# Patient Record
Sex: Female | Born: 1990 | Race: White | Hispanic: No | Marital: Married | Smoking: Former smoker
Health system: Southern US, Community
[De-identification: ages and names within clinical notes are randomized; demographics above are authoritative.]

## PROBLEM LIST (undated history)

## (undated) DIAGNOSIS — G43909 Migraine, unspecified, not intractable, without status migrainosus: Secondary | ICD-10-CM

## (undated) DIAGNOSIS — H30899 Other chorioretinal inflammations, unspecified eye: Secondary | ICD-10-CM

## (undated) DIAGNOSIS — J329 Chronic sinusitis, unspecified: Secondary | ICD-10-CM

## (undated) DIAGNOSIS — I4891 Unspecified atrial fibrillation: Secondary | ICD-10-CM

## (undated) DIAGNOSIS — E785 Hyperlipidemia, unspecified: Secondary | ICD-10-CM

## (undated) DIAGNOSIS — M25572 Pain in left ankle and joints of left foot: Secondary | ICD-10-CM

## (undated) DIAGNOSIS — F329 Major depressive disorder, single episode, unspecified: Secondary | ICD-10-CM

## (undated) DIAGNOSIS — Z7901 Long term (current) use of anticoagulants: Secondary | ICD-10-CM

## (undated) DIAGNOSIS — F32A Depression, unspecified: Secondary | ICD-10-CM

## (undated) DIAGNOSIS — F419 Anxiety disorder, unspecified: Secondary | ICD-10-CM

## (undated) HISTORY — DX: Anxiety disorder, unspecified: F41.9

## (undated) HISTORY — DX: Depression, unspecified: F32.A

## (undated) HISTORY — PX: NASAL SINUS SURGERY: SHX719

## (undated) HISTORY — DX: Other chorioretinal inflammations, unspecified eye: H30.899

## (undated) HISTORY — PX: WISDOM TOOTH EXTRACTION: SHX21

## (undated) HISTORY — DX: Pain in left ankle and joints of left foot: M25.572

## (undated) HISTORY — DX: Chronic sinusitis, unspecified: J32.9

---
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
## 2016-11-06 ENCOUNTER — Encounter (HOSPITAL_COMMUNITY): Payer: Self-pay | Admitting: Emergency Medicine

## 2016-11-06 ENCOUNTER — Emergency Department (HOSPITAL_COMMUNITY)
Admission: EM | Admit: 2016-11-06 | Discharge: 2016-11-06 | Disposition: A | Discharge status: Home / Self Care | Payer: BLUE CROSS/BLUE SHIELD | Attending: Emergency Medicine | Admitting: Emergency Medicine

## 2016-11-06 ENCOUNTER — Emergency Department (HOSPITAL_COMMUNITY): Payer: BLUE CROSS/BLUE SHIELD

## 2016-11-06 DIAGNOSIS — S99912A Unspecified injury of left ankle, initial encounter: Secondary | ICD-10-CM | POA: Diagnosis present

## 2016-11-06 DIAGNOSIS — Y9389 Activity, other specified: Secondary | ICD-10-CM | POA: Diagnosis not present

## 2016-11-06 DIAGNOSIS — Y929 Unspecified place or not applicable: Secondary | ICD-10-CM | POA: Insufficient documentation

## 2016-11-06 DIAGNOSIS — W5512XA Struck by horse, initial encounter: Secondary | ICD-10-CM | POA: Insufficient documentation

## 2016-11-06 DIAGNOSIS — S93402A Sprain of unspecified ligament of left ankle, initial encounter: Secondary | ICD-10-CM | POA: Diagnosis not present

## 2016-11-06 DIAGNOSIS — Y998 Other external cause status: Secondary | ICD-10-CM | POA: Diagnosis not present

## 2016-11-06 DIAGNOSIS — F17200 Nicotine dependence, unspecified, uncomplicated: Secondary | ICD-10-CM | POA: Insufficient documentation

## 2016-11-06 MED ORDER — IBUPROFEN 600 MG PO TABS: 600.0000 mg | ORAL_TABLET | Freq: Four times a day (QID) | ORAL | 0 refills | Status: DC | PRN

## 2016-11-06 MED ORDER — HYDROCODONE-ACETAMINOPHEN 5-325 MG PO TABS: 1.0000 | ORAL_TABLET | ORAL | 0 refills | Status: DC | PRN

## 2016-11-06 NOTE — ED Notes (Signed)
Ortho tech notified of orders. 

## 2016-11-06 NOTE — ED Provider Notes (Signed)
[facility]-EMERGENCY DEPT Provider Note   CSN: [id number] Arrival date & time: 11/06/16  1214     History   Chief Complaint Chief Complaint  Patient presents with  . Ankle Pain    HPI Lori Boyd is a 26 y.o. female.  Pt presents to the ED today with left ankle pain.  Pt was kicked in the left ankle about a month ago by a horse and never sought treatment.  It continued to hurt, but not that badly.  She was dancing last night at a wedding when she felt a pop and severe pain.  She has not been able to put any weight on it.  The pt took 600 mg ibuprofen around 1000 which has helped the pain.      History reviewed. No pertinent past medical history.  There are no active problems to display for this patient.   No past surgical history on file.  OB History    No data available       Home Medications    Prior to Admission medications   Medication Sig Start Date End Date Taking? Authorizing Provider  HYDROcodone-acetaminophen (NORCO/VICODIN) 5-325 MG tablet Take 1 tablet by mouth every 4 (four) hours as needed. 11/06/16   Jacalyn Lefevre, MD  ibuprofen (ADVIL,MOTRIN) 600 MG tablet Take 1 tablet (600 mg total) by mouth every 6 (six) hours as needed. 11/06/16   Jacalyn Lefevre, MD    Family History No family history on file.  Social History Social History  Substance Use Topics  . Smoking status: Current Some Day Smoker  . Smokeless tobacco: Never Used  . Alcohol use Yes     Allergies   Patient has no known allergies.   Review of Systems Review of Systems  Musculoskeletal:       Left ankle pain  All other systems reviewed and are negative.    Physical Exam Updated Vital Signs BP 130/83 (BP Location: Right Arm)   Pulse 98   Temp 98.1 F (36.7 C) (Oral)   Resp 14   Ht 6' (1.829 m)   Wt 135.2 kg (298 lb)   LMP 11/06/2016 (Exact Date)   SpO2 97%   BMI 40.42 kg/m   Physical Exam  Constitutional: She is oriented to person, place, and time. She appears  well-developed and well-nourished.  HENT:  Head: Normocephalic and atraumatic.  Right Ear: External ear normal.  Left Ear: External ear normal.  Nose: Nose normal.  Mouth/Throat: Oropharynx is clear and moist.  Eyes: Pupils are equal, round, and reactive to light. Conjunctivae are normal.  Neck: Normal range of motion. Neck supple.  Cardiovascular: Normal rate, regular rhythm, normal heart sounds and intact distal pulses.   Pulmonary/Chest: Effort normal and breath sounds normal.  Abdominal: Soft. Bowel sounds are normal.  Musculoskeletal:       Left ankle: She exhibits swelling. Tenderness. Lateral malleolus tenderness found.  Neurological: She is alert and oriented to person, place, and time.  Nursing note and vitals reviewed.    ED Treatments / Results  Labs (all labs ordered are listed, but only abnormal results are displayed) Labs Reviewed - No data to display  EKG  EKG Interpretation None       Radiology Dg Ankle Complete Left  Result Date: 11/06/2016 CLINICAL DATA:  Recent inversion injury with pain, initial encounter EXAM: LEFT ANKLE COMPLETE - 3+ VIEW COMPARISON:  None. FINDINGS: Soft tissue swelling is noted primarily laterally. No acute fracture or dislocation is noted. IMPRESSION: Soft tissue swelling  without acute bony abnormality. Electronically Signed   By: Alcide CleverMark  Lukens M.D.   On: 11/06/2016 13:54    Procedures Procedures (including critical care time)  Medications Ordered in ED Medications - No data to display   Initial Impression / Assessment and Plan / ED Course  I have reviewed the triage vital signs and the nursing notes.  Pertinent labs & imaging results that were available during my care of the patient were reviewed by me and considered in my medical decision making (see chart for details).    Pt's xray neg.  She is given air splint and crutches.  She is given a printout of ankle exercises.  She is given the number to ortho.  She knows to return  if worse.   Final Clinical Impressions(s) / ED Diagnoses   Final diagnoses:  Sprain of left ankle, unspecified ligament, initial encounter    New Prescriptions New Prescriptions   HYDROCODONE-ACETAMINOPHEN (NORCO/VICODIN) 5-325 MG TABLET    Take 1 tablet by mouth every 4 (four) hours as needed.   IBUPROFEN (ADVIL,MOTRIN) 600 MG TABLET    Take 1 tablet (600 mg total) by mouth every 6 (six) hours as needed.     Jacalyn LefevreHaviland, Bernabe Dorce, MD 11/06/16 1406

## 2016-11-06 NOTE — ED Triage Notes (Signed)
Has had left ankle pain on and off for the last  Month but last night was dancing and rolled her ankle and now it bears any weight. Can wiggle toes has good pulse

## 2016-11-06 NOTE — Progress Notes (Signed)
Orthopedic Tech Progress Note Patient Details:  Lori Boyd 11-14-1990 [id number]  Ortho Devices Type of Ortho Device: ASO, Crutches Ortho Device/Splint Interventions: Application   Saul FordyceJennifer C Lashan Macias 11/06/2016, 2:26 PM

## 2016-11-09 ENCOUNTER — Encounter (INDEPENDENT_AMBULATORY_CARE_PROVIDER_SITE_OTHER): Payer: Self-pay | Admitting: Family

## 2016-11-09 ENCOUNTER — Ambulatory Visit (INDEPENDENT_AMBULATORY_CARE_PROVIDER_SITE_OTHER): Payer: BLUE CROSS/BLUE SHIELD | Admitting: Family

## 2016-11-09 VITALS — Ht 72.0 in | Wt 298.0 lb

## 2016-11-09 DIAGNOSIS — S93492A Sprain of other ligament of left ankle, initial encounter: Secondary | ICD-10-CM

## 2016-11-09 NOTE — Progress Notes (Signed)
   Office Visit Note   Patient: Lori Boyd           Date of Birth: [birth year]           MRN: [id number] Visit Date: 11/09/2016              Requested by: No referring provider defined for this encounter. PCP: Patient, No Pcp Per  Chief Complaint  Patient presents with  . Left Ankle - Injury      HPI: The patient is a 26 year old woman who is seen today for evaluation left ankle pain. She was dancing at a wedding on Saturday when she twisted her ankle is unsure of the mechanism of injury. Just felt a pop and heard a pop. She was unable to bear weight at that time. Has been seen and evaluated in the emergency department. Radiographs were negative. She is in a ASO using crutches. She states been working on range of motion but continues to be unable to bear weight.  Assessment & Plan: Visit Diagnoses:  1. Sprain of anterior talofibular ligament of left ankle, initial encounter     Plan: We'll provide her Cam Walker that this will allow her to be weightbearing as she needs to weight-bear due to class. May advance to the ASO as tolerated. Discussed the importance of working on range of motion writing the ABCs with her ankle. Use ibuprofen or Aleve as needed for pain and inflammation. May continue using ice. We will follow-up with her in 2 weeks and evaluate her return to work at that time.  Follow-Up Instructions: Return in about 2 weeks (around 11/23/2016).   Left Ankle Exam  Swelling: moderate  Tenderness  The patient is experiencing tenderness in the ATF.   Tests  Anterior drawer: negative  Other  Erythema: absent Pulse: present      Patient is alert, oriented, no adenopathy, well-dressed, normal affect, normal respiratory effort.   Imaging: No results found. No images are attached to the encounter.  Labs: No results found for: HGBA1C, ESRSEDRATE, CRP, LABURIC, REPTSTATUS, GRAMSTAIN, CULT, LABORGA  Orders:  No orders of the defined types were placed in this  encounter.  No orders of the defined types were placed in this encounter.    Procedures: No procedures performed  Clinical Data: No additional findings.  ROS:  All other systems negative, except as noted in the HPI. Review of Systems  Constitutional: Negative for chills and fever.  Musculoskeletal: Positive for arthralgias, joint swelling and myalgias.    Objective: Vital Signs: Ht 6' (1.829 m)   Wt 298 lb (135.2 kg)   LMP 11/06/2016 (Exact Date)   BMI 40.42 kg/m   Specialty Comments:  No specialty comments available.  PMFS History: There are no active problems to display for this patient.  No past medical history on file.  No family history on file.  No past surgical history on file. Social History   Occupational History  . Not on file.   Social History Main Topics  . Smoking status: Current Some Day Smoker  . Smokeless tobacco: Never Used  . Alcohol use Yes  . Drug use: Yes    Types: Marijuana  . Sexual activity: Not on file

## 2016-11-24 ENCOUNTER — Ambulatory Visit (INDEPENDENT_AMBULATORY_CARE_PROVIDER_SITE_OTHER): Payer: BLUE CROSS/BLUE SHIELD | Admitting: Orthopedic Surgery

## 2016-11-24 ENCOUNTER — Encounter (INDEPENDENT_AMBULATORY_CARE_PROVIDER_SITE_OTHER): Payer: Self-pay | Admitting: Orthopedic Surgery

## 2016-11-24 DIAGNOSIS — S93492A Sprain of other ligament of left ankle, initial encounter: Secondary | ICD-10-CM | POA: Insufficient documentation

## 2016-11-24 DIAGNOSIS — S93492D Sprain of other ligament of left ankle, subsequent encounter: Secondary | ICD-10-CM

## 2016-11-24 NOTE — Progress Notes (Signed)
   Office Visit Note   Patient: Lori Boyd           Date of Birth: May 17, 1990           MRN: [id number] Visit Date: 11/24/2016              Requested by: No referring provider defined for this encounter. PCP: Patient, No Pcp Per  Chief Complaint  Patient presents with  . Left Ankle - Follow-up      HPI: Patient is a 26 year old woman who presents follow-up status post sprain of the anterior talofibular ligament left ankle she is about 3 weeks out. Patient states she feels like she has instability of the ankle even with wearing the ankle stabilizing orthosis. She states she cannot stand for greater than 25 minutes at a time.  Assessment & Plan: Visit Diagnoses:  1. Sprain of anterior talofibular ligament of left ankle, subsequent encounter     Plan: Recommended continuing wearing the ASO for 3 weeks at that time follow-up at the ankle was still unstable we would need to consider reconstruction of the anterior talofibular ligament. 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  Follow-Up Instructions: Return in about 3 weeks (around 12/15/2016).   Ortho Exam  Patient is alert, oriented, no adenopathy, well-dressed, normal affect, normal respiratory effort. Patient has an antalgic gait. She stands with her feet plantar grade. Anterior drawer the right ankle is stable with no laxity. Anterior drawer on the left ankle has a positive clunk with anterior translation of about 3 mm. She is tender to palpation over the anterior talofibular ligament. Anterior ankle joint is minimally tender to palpation. Patient is also tender to palpation over the calcaneofibular ligament. Deltoid ligament is nontender to palpation. There is no ecchymosis no bruising.  Imaging: No results found. No images are attached to the encounter.  Labs: No results found for: HGBA1C, ESRSEDRATE, CRP, LABURIC, REPTSTATUS, GRAMSTAIN, CULT,  LABORGA  Orders:  No orders of the defined types were placed in this encounter.  No orders of the defined types were placed in this encounter.    Procedures: No procedures performed  Clinical Data: No additional findings.  ROS:  All other systems negative, except as noted in the HPI. Review of Systems  Objective: Vital Signs: LMP 11/06/2016 (Exact Date)   Specialty Comments:  No specialty comments available.  PMFS History: Patient Active Problem List   Diagnosis Date Noted  . Sprain of anterior talofibular ligament of left ankle 11/24/2016   No past medical history on file.  No family history on file.  No past surgical history on file. Social History   Occupational History  . Not on file.   Social History Main Topics  . Smoking status: Current Some Day Smoker  . Smokeless tobacco: Never Used  . Alcohol use Yes  . Drug use: Yes    Types: Marijuana  . Sexual activity: Not on file

## 2016-12-14 ENCOUNTER — Encounter (INDEPENDENT_AMBULATORY_CARE_PROVIDER_SITE_OTHER): Payer: Self-pay | Admitting: Orthopedic Surgery

## 2016-12-14 ENCOUNTER — Ambulatory Visit (INDEPENDENT_AMBULATORY_CARE_PROVIDER_SITE_OTHER): Payer: BLUE CROSS/BLUE SHIELD | Admitting: Orthopedic Surgery

## 2016-12-14 DIAGNOSIS — S9305XA Dislocation of left ankle joint, initial encounter: Secondary | ICD-10-CM | POA: Insufficient documentation

## 2016-12-14 DIAGNOSIS — S93492D Sprain of other ligament of left ankle, subsequent encounter: Secondary | ICD-10-CM

## 2016-12-14 DIAGNOSIS — S9305XS Dislocation of left ankle joint, sequela: Secondary | ICD-10-CM

## 2016-12-14 DIAGNOSIS — IMO0001 Reserved for inherently not codable concepts without codable children: Secondary | ICD-10-CM

## 2016-12-14 DIAGNOSIS — S93332A Other subluxation of left foot, initial encounter: Secondary | ICD-10-CM | POA: Insufficient documentation

## 2016-12-14 NOTE — Progress Notes (Signed)
   Office Visit Note   Patient: Lori Boyd           Date of Birth: [birth year]           MRN: [id number] Visit Date: 12/14/2016              Requested by: No referring provider defined for this encounter. PCP: Patient, No Pcp Per  Chief Complaint  Patient presents with  . Left Ankle - Follow-up      HPI: Patient is a 26 year old woman is status post sprain left ankle. She has worn an ASO and currently is not wearing it. She complains of laxity and dislocation of the peroneal tendons she states they pop out of place. She has no pain anteriorly over the ankle.  Assessment & Plan: Visit Diagnoses:  1. Sprain of anterior talofibular ligament of left ankle, subsequent encounter   2. Subluxation of peroneal tendon of left foot, sequela     Plan: Due to the instability of anterior talofibular ligament and peroneal tendon subluxation patient states she like to proceed with surgical intervention. We'll plan for reconstruction of the peroneal tendon retinaculum as well as repair of the anterior talofibular ligament. Risks and benefits were discussed patient states she understands wish to proceed at this time discussed that she will need to be nonweightbearing until the incision heals and then will be weightbearing in a fracture boot for a total of 6 weeks.  Follow-Up Instructions: Return in about 2 weeks (around 12/28/2016).   Ortho Exam  Patient is alert, oriented, no adenopathy, well-dressed, normal affect, normal respiratory effort. Examination patient has no redness no cellulitis. With range of motion of her ankle and resisted eversion patient's peroneal tendons subluxed they are tender to palpation. She is also tender to palpation over the anterior talofibular ligament she has a positive anterior drawer of about 4 mm. There is a negative anterior drawer on the right ankle. Anteriorly or her ankle is nontender to palpation or with range of motion.  Imaging: No results found. No  images are attached to the encounter.  Labs: No results found for: HGBA1C, ESRSEDRATE, CRP, LABURIC, REPTSTATUS, GRAMSTAIN, CULT, LABORGA  Orders:  No orders of the defined types were placed in this encounter.  No orders of the defined types were placed in this encounter.    Procedures: No procedures performed  Clinical Data: No additional findings.  ROS:  All other systems negative, except as noted in the HPI. Review of Systems  Objective: Vital Signs: There were no vitals taken for this visit.  Specialty Comments:  No specialty comments available.  PMFS History: Patient Active Problem List   Diagnosis Date Noted  . 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 Sprain of anterior talofibular ligament of left ankle 11/24/2016   No past medical history on file.  No family history on file.  No past surgical history on file. Social History   Occupational History  . Not on file.   Social History Main Topics  . Smoking status: Current Some Day Smoker  . Smokeless tobacco: Never Used  . Alcohol use Yes  . Drug use: Yes    Types: Marijuana  . Sexual activity: Not on file

## 2017-01-03 DIAGNOSIS — S86392D Other injury of muscle(s) and tendon(s) of peroneal muscle group at lower leg level, left leg, subsequent encounter: Secondary | ICD-10-CM | POA: Diagnosis not present

## 2017-01-03 DIAGNOSIS — S93412D Sprain of calcaneofibular ligament of left ankle, subsequent encounter: Secondary | ICD-10-CM | POA: Diagnosis not present

## 2017-01-03 HISTORY — PX: OTHER SURGICAL HISTORY: SHX169

## 2017-01-12 ENCOUNTER — Ambulatory Visit (INDEPENDENT_AMBULATORY_CARE_PROVIDER_SITE_OTHER): Payer: BLUE CROSS/BLUE SHIELD | Admitting: Orthopedic Surgery

## 2017-01-12 ENCOUNTER — Encounter (INDEPENDENT_AMBULATORY_CARE_PROVIDER_SITE_OTHER): Payer: Self-pay | Admitting: Orthopedic Surgery

## 2017-01-12 DIAGNOSIS — S93492D Sprain of other ligament of left ankle, subsequent encounter: Secondary | ICD-10-CM

## 2017-01-12 DIAGNOSIS — IMO0001 Reserved for inherently not codable concepts without codable children: Secondary | ICD-10-CM

## 2017-01-12 DIAGNOSIS — S9305XS Dislocation of left ankle joint, sequela: Secondary | ICD-10-CM

## 2017-01-12 NOTE — Progress Notes (Signed)
   Post-Op Visit Note   Patient: Lori Boyd           Date of Birth: 01-Jan-1991           MRN: [id number] Visit Date: 01/12/2017 PCP: Patient, No Pcp Per  Chief Complaint:  Chief Complaint  Patient presents with  . Left Ankle - Routine Post Op    01/03/17 Left Ankle Reconstruction of Peroneal Tendon Retinaculum and repair of Anterior Talofibular Ligament. 9 days post op.     HPI:  HPI The patient is a 26 year old woman seen today status post left ankle surgery on October 19. She's been nonweightbearing with a kneeling scooter.  Ortho Exam Incision well approximated with sutures, healing well there is no erythema drainage or sign of infection. Minimal swelling to her ankle.  Visit Diagnoses:  1. Subluxation of peroneal tendon of left foot, sequela   2. Sprain of anterior talofibular ligament of left ankle, subsequent encounter     Plan: begin daily dial soap cleansing. Continue nonweight bearing. Dry dressing changes. Follow up in 1 week for suture removal.  Follow-Up Instructions: Return in about 1 week (around 01/19/2017).   Imaging: No results found.  Orders:  No orders of the defined types were placed in this encounter.  No orders of the defined types were placed in this encounter.    PMFS History: Patient Active Problem List   Diagnosis Date Noted  . 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 Sprain of anterior talofibular ligament of left ankle 11/24/2016   No past medical history on file.  No family history on file.  Past Surgical History:  Procedure Laterality Date  . left ankle reconstruction Left 01/03/2017   Dr. Lajoyce Cornersuda   Social History   Occupational History  . Not on file.   Social History Main Topics  . Smoking status: Current Some Day Smoker  . Smokeless tobacco: Never Used  . Alcohol use Yes  . Drug use: Yes    Types: Marijuana  . Sexual activity: Not on file

## 2017-01-16 ENCOUNTER — Inpatient Hospital Stay (INDEPENDENT_AMBULATORY_CARE_PROVIDER_SITE_OTHER): Payer: BLUE CROSS/BLUE SHIELD | Admitting: Orthopedic Surgery

## 2017-01-18 ENCOUNTER — Ambulatory Visit (INDEPENDENT_AMBULATORY_CARE_PROVIDER_SITE_OTHER): Payer: BLUE CROSS/BLUE SHIELD | Admitting: Orthopedic Surgery

## 2017-01-18 ENCOUNTER — Encounter (INDEPENDENT_AMBULATORY_CARE_PROVIDER_SITE_OTHER): Payer: Self-pay

## 2017-01-19 ENCOUNTER — Ambulatory Visit (INDEPENDENT_AMBULATORY_CARE_PROVIDER_SITE_OTHER): Payer: BLUE CROSS/BLUE SHIELD | Admitting: Orthopedic Surgery

## 2017-01-19 DIAGNOSIS — IMO0001 Reserved for inherently not codable concepts without codable children: Secondary | ICD-10-CM

## 2017-01-19 DIAGNOSIS — S93492D Sprain of other ligament of left ankle, subsequent encounter: Secondary | ICD-10-CM

## 2017-01-19 DIAGNOSIS — S9305XS Dislocation of left ankle joint, sequela: Secondary | ICD-10-CM

## 2017-01-19 NOTE — Progress Notes (Signed)
   Office Visit Note   Patient: Lori Boyd           Date of Birth: [birth year]           MRN: [id number] Visit Date: 01/19/2017              Requested by: No referring provider defined for this encounter. PCP: Patient, No Pcp Per  No chief complaint on file.     HPI: Patient presents 1 week follow-up status post reconstruction of the peroneal retinaculum and repair of the anterior talofibular ligament with reinforcement of the extensor retinaculum to the left ankle.  Patient is currently on a kneeling scooter she states her fracture boot is at home.  She has been nonweightbearing.  Assessment & Plan: Visit Diagnoses:  1. Subluxation of peroneal tendon of left foot, sequela   2. Sprain of anterior talofibular ligament of left ankle, subsequent encounter     Plan: Continue nonweightbearing she should wear the fracture boot for transfers.  She is given instructions to continue working on dorsiflexion of the ankle.  Instructions for scar massage.  Weightbearing as tolerated in the fracture boot at follow-up.  Follow-Up Instructions: Return in about 2 weeks (around 02/02/2017).   Ortho Exam  Patient is alert, oriented, no adenopathy, well-dressed, normal affect, normal respiratory effort. Examination the incision is healed quite nicely there is no redness no cellulitis no drainage.  We will harvest the sutures today.  She has good range of motion of the ankle.  She has anterior drawer of about 2 mm.  Her ankle is stable.  Imaging: No results found. No images are attached to the encounter.  Labs: No results found for: HGBA1C, ESRSEDRATE, CRP, LABURIC, REPTSTATUS, GRAMSTAIN, CULT, LABORGA  Orders:  No orders of the defined types were placed in this encounter.  No orders of the defined types were placed in this encounter.    Procedures: No procedures performed  Clinical Data: No additional findings.  ROS:  All other systems negative, except as noted in the  HPI. Review of Systems  Objective: Vital Signs: There were no vitals taken for this visit.  Specialty Comments:  No specialty comments available.  PMFS History: Patient Active Problem List   Diagnosis Date Noted  . 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 Sprain of anterior talofibular ligament of left ankle 11/24/2016   No past medical history on file.  No family history on file.  Past Surgical History:  Procedure Laterality Date  . left ankle reconstruction Left 01/03/2017   Dr. Lajoyce Cornersuda   Social History   Occupational History  . Not on file.   Social History Main Topics  . Smoking status: Current Some Day Smoker  . Smokeless tobacco: Never Used  . Alcohol use Yes  . Drug use: Yes    Types: Marijuana  . Sexual activity: Not on file

## 2017-02-08 ENCOUNTER — Ambulatory Visit (INDEPENDENT_AMBULATORY_CARE_PROVIDER_SITE_OTHER): Payer: BLUE CROSS/BLUE SHIELD | Admitting: Orthopedic Surgery

## 2017-02-14 ENCOUNTER — Encounter (INDEPENDENT_AMBULATORY_CARE_PROVIDER_SITE_OTHER): Payer: Self-pay | Admitting: Orthopedic Surgery

## 2017-02-14 ENCOUNTER — Ambulatory Visit (INDEPENDENT_AMBULATORY_CARE_PROVIDER_SITE_OTHER): Payer: BLUE CROSS/BLUE SHIELD | Admitting: Orthopedic Surgery

## 2017-02-14 VITALS — Ht 72.0 in | Wt 298.0 lb

## 2017-02-14 DIAGNOSIS — S93492D Sprain of other ligament of left ankle, subsequent encounter: Secondary | ICD-10-CM

## 2017-02-14 DIAGNOSIS — S9305XS Dislocation of left ankle joint, sequela: Secondary | ICD-10-CM

## 2017-02-14 DIAGNOSIS — IMO0001 Reserved for inherently not codable concepts without codable children: Secondary | ICD-10-CM

## 2017-02-14 NOTE — Progress Notes (Signed)
   Office Visit Note   Patient: Lori Boyd           Date of Birth: 1990/11/24           MRN: [id number] Visit Date: 02/14/2017              Requested by: No referring provider defined for this encounter. PCP: Patient, No Pcp Per  Chief Complaint  Patient presents with  . Left Ankle - Routine Post Op    01/03/17 reconstruction peroneal retinaculum and repair anterior talofibular ligament       HPI: Patient is a 26 year old woman who presents status post anterior talofibular ligament reconstruction as well as a peroneal tendon retinaculum reconstruction.  Patient has no complaints she states she is doing well she is in a fracture boot.  Assessment & Plan: Visit Diagnoses:  1. Subluxation of peroneal tendon of left foot, sequela   2. Sprain of anterior talofibular ligament of left ankle, subsequent encounter     Plan: We will have her transition from the fracture boot to an ASO.  She will wear this up to 6 weeks and patient states she would like to follow-up as needed.  Follow-Up Instructions: Return if symptoms worsen or fail to improve.   Ortho Exam  Patient is alert, oriented, no adenopathy, well-dressed, normal affect, normal respiratory effort. Examination patient is a stable anterior drawer there is no subluxation of the peroneal tendons.  Patient was given instructions for scar massage to minimize keloiding of the scar.  There is no redness no cellulitis no swelling no signs of infection.  Imaging: No results found. No images are attached to the encounter.  Labs: No results found for: HGBA1C, ESRSEDRATE, CRP, LABURIC, REPTSTATUS, GRAMSTAIN, CULT, LABORGA  @LABSALLVALUES (HGBA1)@  @BMI1 @  Orders:  No orders of the defined types were placed in this encounter.  No orders of the defined types were placed in this encounter.    Procedures: No procedures performed  Clinical Data: No additional findings.  ROS:  All other systems negative, except as noted  in the HPI. Review of Systems  Objective: Vital Signs: Ht 6' (1.829 m)   Wt 298 lb (135.2 kg)   BMI 40.42 kg/m   Specialty Comments:  No specialty comments available.  PMFS History: Patient Active Problem List   Diagnosis Date Noted  . 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 Sprain of anterior talofibular ligament of left ankle 11/24/2016   History reviewed. No pertinent past medical history.  History reviewed. No pertinent family history.  Past Surgical History:  Procedure Laterality Date  . left ankle reconstruction Left 01/03/2017   Dr. Lajoyce Cornersuda   Social History   Occupational History  . Not on file  Tobacco Use  . Smoking status: Current Some Day Smoker  . Smokeless tobacco: Never Used  Substance and Sexual Activity  . Alcohol use: Yes  . Drug use: Yes    Types: Marijuana  . Sexual activity: Not on file

## 2017-12-11 ENCOUNTER — Encounter (INDEPENDENT_AMBULATORY_CARE_PROVIDER_SITE_OTHER): Payer: Self-pay | Admitting: Orthopedic Surgery

## 2017-12-11 ENCOUNTER — Ambulatory Visit (INDEPENDENT_AMBULATORY_CARE_PROVIDER_SITE_OTHER): Payer: BLUE CROSS/BLUE SHIELD | Admitting: Orthopedic Surgery

## 2017-12-11 DIAGNOSIS — S9305XS Dislocation of left ankle joint, sequela: Secondary | ICD-10-CM | POA: Diagnosis not present

## 2017-12-11 DIAGNOSIS — IMO0001 Reserved for inherently not codable concepts without codable children: Secondary | ICD-10-CM

## 2017-12-11 NOTE — Progress Notes (Signed)
   Office Visit Note   Patient: Lori Boyd           Date of Birth: December 01, 1990           MRN: [id number] Visit Date: 12/11/2017              Requested by: No referring provider defined for this encounter. PCP: Patient, No Pcp Per  Chief Complaint  Patient presents with  . Left Ankle - Follow-up, Pain, Edema      HPI: Patient is a 27 year old woman who presents 1 year status post reconstruction of the peroneal retinaculum for subluxation of the peroneal tendons.  Patient states that now she occasionally has some sharp shooting pain worse when she is casually walking she is using ibuprofen occasionally she denies any subluxation of the peroneal tendons.  Assessment & Plan: Visit Diagnoses:  1. Subluxation of peroneal tendon of left foot, sequela     Plan: Patient does appear to have some weakness of the ankle we will set her up with physical therapy at [facility] for proprioception strengthening and iontophoresis for modalities to help with the tendon symptoms.  Follow-up in 4 weeks.  Follow-Up Instructions: Return in about 4 weeks (around 01/08/2018).   Ortho Exam  Patient is alert, oriented, no adenopathy, well-dressed, normal affect, normal respiratory effort. Examination patient has good pulses she has good ankle good subtalar motion with resisted eversion there is no subluxation of the peroneal tendons.  She is point tender to palpation over the peroneal tendons and there is a slight amount of popping with range of motion.  Patient has a normal gait.  Imaging: No results found. No images are attached to the encounter.  Labs: No results found for: HGBA1C, ESRSEDRATE, CRP, LABURIC, REPTSTATUS, GRAMSTAIN, CULT, LABORGA   No results found for: ALBUMIN, PREALBUMIN, LABURIC  There is no height or weight on file to calculate BMI.  Orders:  No orders of the defined types were placed in this encounter.  No orders of the defined types were placed in this encounter.    Procedures: No procedures performed  Clinical Data: No additional findings.  ROS:  All other systems negative, except as noted in the HPI. Review of Systems  Objective: Vital Signs: There were no vitals taken for this visit.  Specialty Comments:  No specialty comments available.  PMFS History: Patient Active Problem List   Diagnosis Date Noted  . 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 Sprain of anterior talofibular ligament of left ankle 11/24/2016   History reviewed. No pertinent past medical history.  History reviewed. No pertinent family history.  Past Surgical History:  Procedure Laterality Date  . left ankle reconstruction Left 01/03/2017   Dr. Lajoyce Cornersuda   Social History   Occupational History  . Not on file  Tobacco Use  . Smoking status: Current Some Day Smoker  . Smokeless tobacco: Never Used  Substance and Sexual Activity  . Alcohol use: Yes  . Drug use: Yes    Types: Marijuana  . Sexual activity: Not on file

## 2018-01-15 ENCOUNTER — Ambulatory Visit (INDEPENDENT_AMBULATORY_CARE_PROVIDER_SITE_OTHER): Payer: BLUE CROSS/BLUE SHIELD | Admitting: Orthopedic Surgery

## 2018-05-21 ENCOUNTER — Encounter (INDEPENDENT_AMBULATORY_CARE_PROVIDER_SITE_OTHER): Payer: Self-pay | Admitting: Orthopedic Surgery

## 2018-05-21 ENCOUNTER — Ambulatory Visit (INDEPENDENT_AMBULATORY_CARE_PROVIDER_SITE_OTHER): Payer: BLUE CROSS/BLUE SHIELD

## 2018-05-21 ENCOUNTER — Ambulatory Visit (INDEPENDENT_AMBULATORY_CARE_PROVIDER_SITE_OTHER): Payer: BLUE CROSS/BLUE SHIELD | Admitting: Physician Assistant

## 2018-05-21 VITALS — Ht 72.0 in | Wt 298.0 lb

## 2018-05-21 DIAGNOSIS — M79672 Pain in left foot: Secondary | ICD-10-CM

## 2018-05-21 DIAGNOSIS — M6702 Short Achilles tendon (acquired), left ankle: Secondary | ICD-10-CM | POA: Diagnosis not present

## 2018-05-21 NOTE — Progress Notes (Signed)
Office Visit Note   Patient: Lori Boyd           Date of Birth: 10-Feb-1991           MRN: [id number] Visit Date: 05/21/2018              Requested by: No referring provider defined for this encounter. PCP: Patient, No Pcp Per  Chief Complaint  Patient presents with  . Left Foot - Pain      HPI: The patient is a 28 year old woman who previously underwent left reconstruction of the peroneal retinaculum for subluxation of the peroneal tendons about a year and a half ago nail.  She comes in today with complaints of pain in her forefoot.  She reports that the pain is throbbing and she particularly notices after she has been walking for longer periods or trying to exercise such as jogging or trying to dance.  She reports that the pain is very intense at times.  She is tried to take ibuprofen for pain without relief.  She still reports some occasional pain in the ankle and pain about the tendon repair but overall this is doing better.  She occasionally has some swelling around this area.  Assessment & Plan: Visit Diagnoses:  1. Acute foot pain, left   2. Short Achilles tendon (acquired), left ankle     Plan: Counseled the patient that she is likely overloading the metatarsal heads as she has limited motion in her Achilles tendon and this is limiting her ankle dorsiflexion and increasing pressure over her metatarsal heads.  We discussed doing Achilles stretching exercises 5 times daily.  Also recommended a stiff soled shoe such as a [place] walking shoe.  The Achilles stretching exercises were demonstrated for the patient and she should do these 5 times daily.  She will follow-up here in about 4 weeks or sooner should she have difficulties in the interim.  Follow-Up Instructions: Return in about 4 weeks (around 06/18/2018).   Ortho Exam  Patient is alert, oriented, no adenopathy, well-dressed, normal affect, normal respiratory effort. Exam of the left foot shows tenderness over  the dorsal foot over the third and fourth metatarsals.  There is no ulcers no signs of infection or cellulitis.  She has a well-healed incision over her ankle.  She has decreased range of motion at the ankle with dorsiflexion barely reaching neutral.  She has palpable pedal pulses.  Imaging: Xr Foot Complete Left  Result Date: 05/21/2018 Radiographs of left foot without signs of acute fracture or other osseous abnormalities.   No images are attached to the encounter.  Labs: No results found for: HGBA1C, ESRSEDRATE, CRP, LABURIC, REPTSTATUS, GRAMSTAIN, CULT, LABORGA   No results found for: ALBUMIN, PREALBUMIN, LABURIC  Body mass index is 40.42 kg/m.  Orders:  Orders Placed This Encounter  Procedures  . XR Foot Complete Left   No orders of the defined types were placed in this encounter.    Procedures: No procedures performed  Clinical Data: No additional findings.  ROS:  All other systems negative, except as noted in the HPI. Review of Systems  Objective: Vital Signs: Ht 6' (1.829 m)   Wt 298 lb (135.2 kg)   BMI 40.42 kg/m   Specialty Comments:  No specialty comments available.  PMFS History: Patient Active Problem List   Diagnosis Date Noted  . 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 Sprain of anterior talofibular ligament of left ankle 11/24/2016   No past  medical history on file.  No family history on file.  Past Surgical History:  Procedure Laterality Date  . left ankle reconstruction Left 01/03/2017   Dr. Lajoyce Corners   Social History   Occupational History  . Not on file  Tobacco Use  . Smoking status: Current Some Day Smoker  . Smokeless tobacco: Never Used  Substance and Sexual Activity  . Alcohol use: Yes  . Drug use: Yes    Types: Marijuana  . Sexual activity: Not on file

## 2018-05-24 ENCOUNTER — Telehealth (INDEPENDENT_AMBULATORY_CARE_PROVIDER_SITE_OTHER): Payer: Self-pay | Admitting: Orthopedic Surgery

## 2018-05-24 NOTE — Telephone Encounter (Signed)
Patient called asked if she can get a shorter medical for her left foot. The number to contact patient is [phone]

## 2018-05-24 NOTE — Telephone Encounter (Signed)
I called pt and she will come in tomorrow at 2pm to be fitted for a short fx boot.

## 2018-06-15 ENCOUNTER — Telehealth (INDEPENDENT_AMBULATORY_CARE_PROVIDER_SITE_OTHER): Payer: Self-pay | Admitting: *Deleted

## 2018-06-15 NOTE — Telephone Encounter (Signed)
Asked COVID-19 pre screening questions to patient and they answered no to all. 

## 2018-06-18 ENCOUNTER — Ambulatory Visit (INDEPENDENT_AMBULATORY_CARE_PROVIDER_SITE_OTHER): Payer: BLUE CROSS/BLUE SHIELD | Admitting: Orthopedic Surgery

## 2018-09-03 IMAGING — DX DG ANKLE COMPLETE 3+V*L*
3 series · 3 of 3 positions shown · non-contrast
Comparison: None.

CLINICAL DATA: Recent inversion injury with pain, initial encounter

EXAM:
LEFT ANKLE COMPLETE - 3+ VIEW

[x ankle ap left]
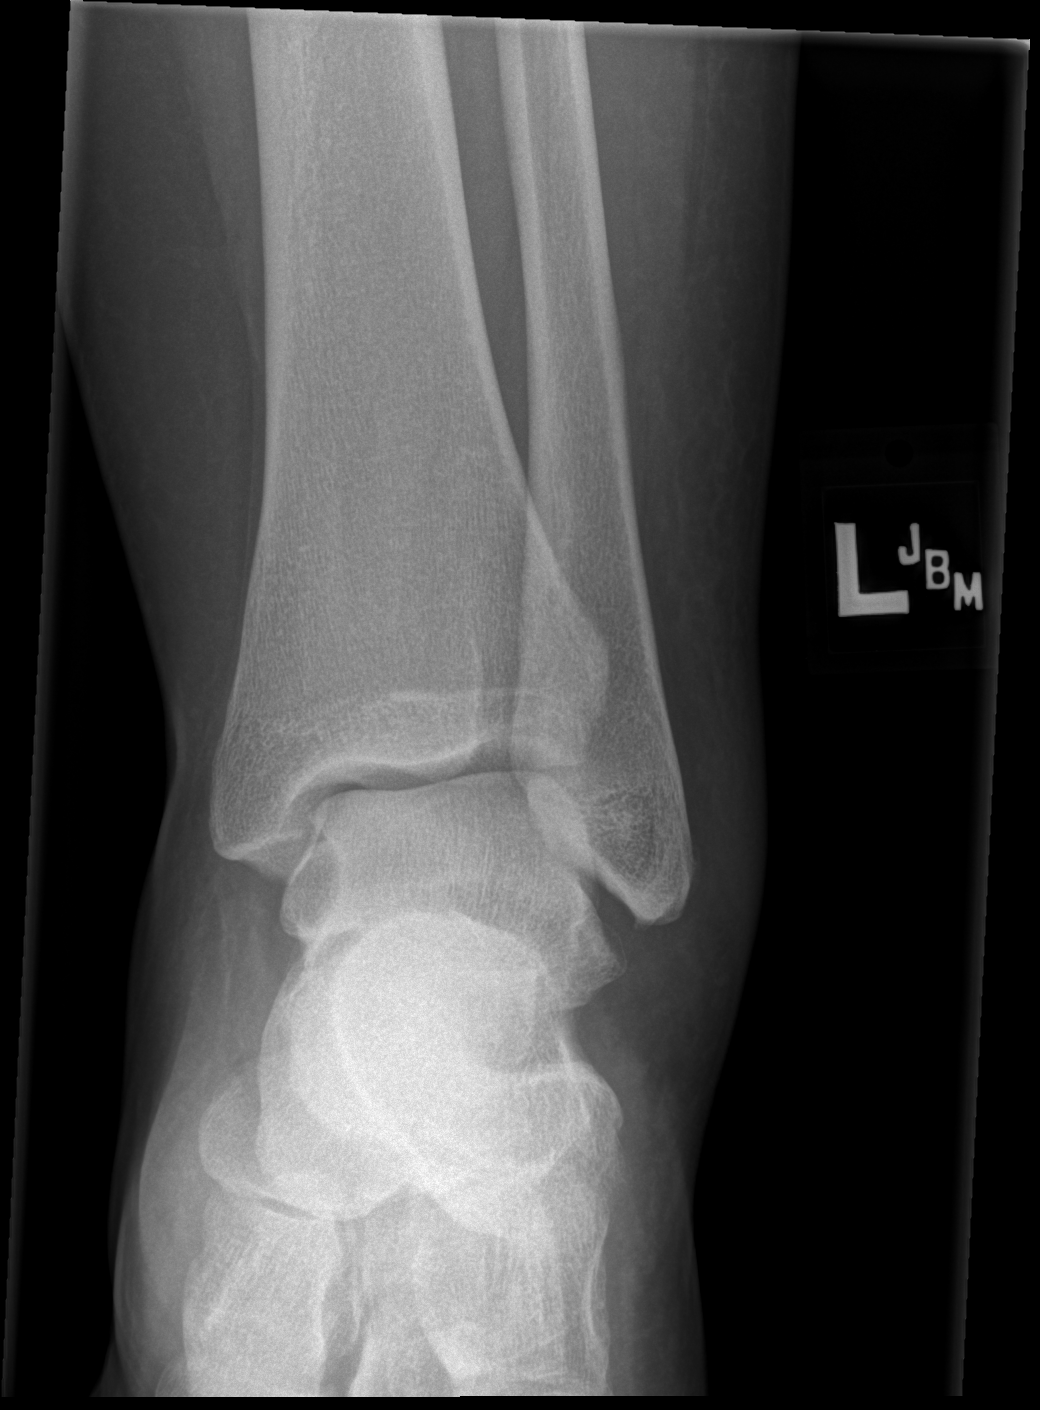

[x ankle obl left]
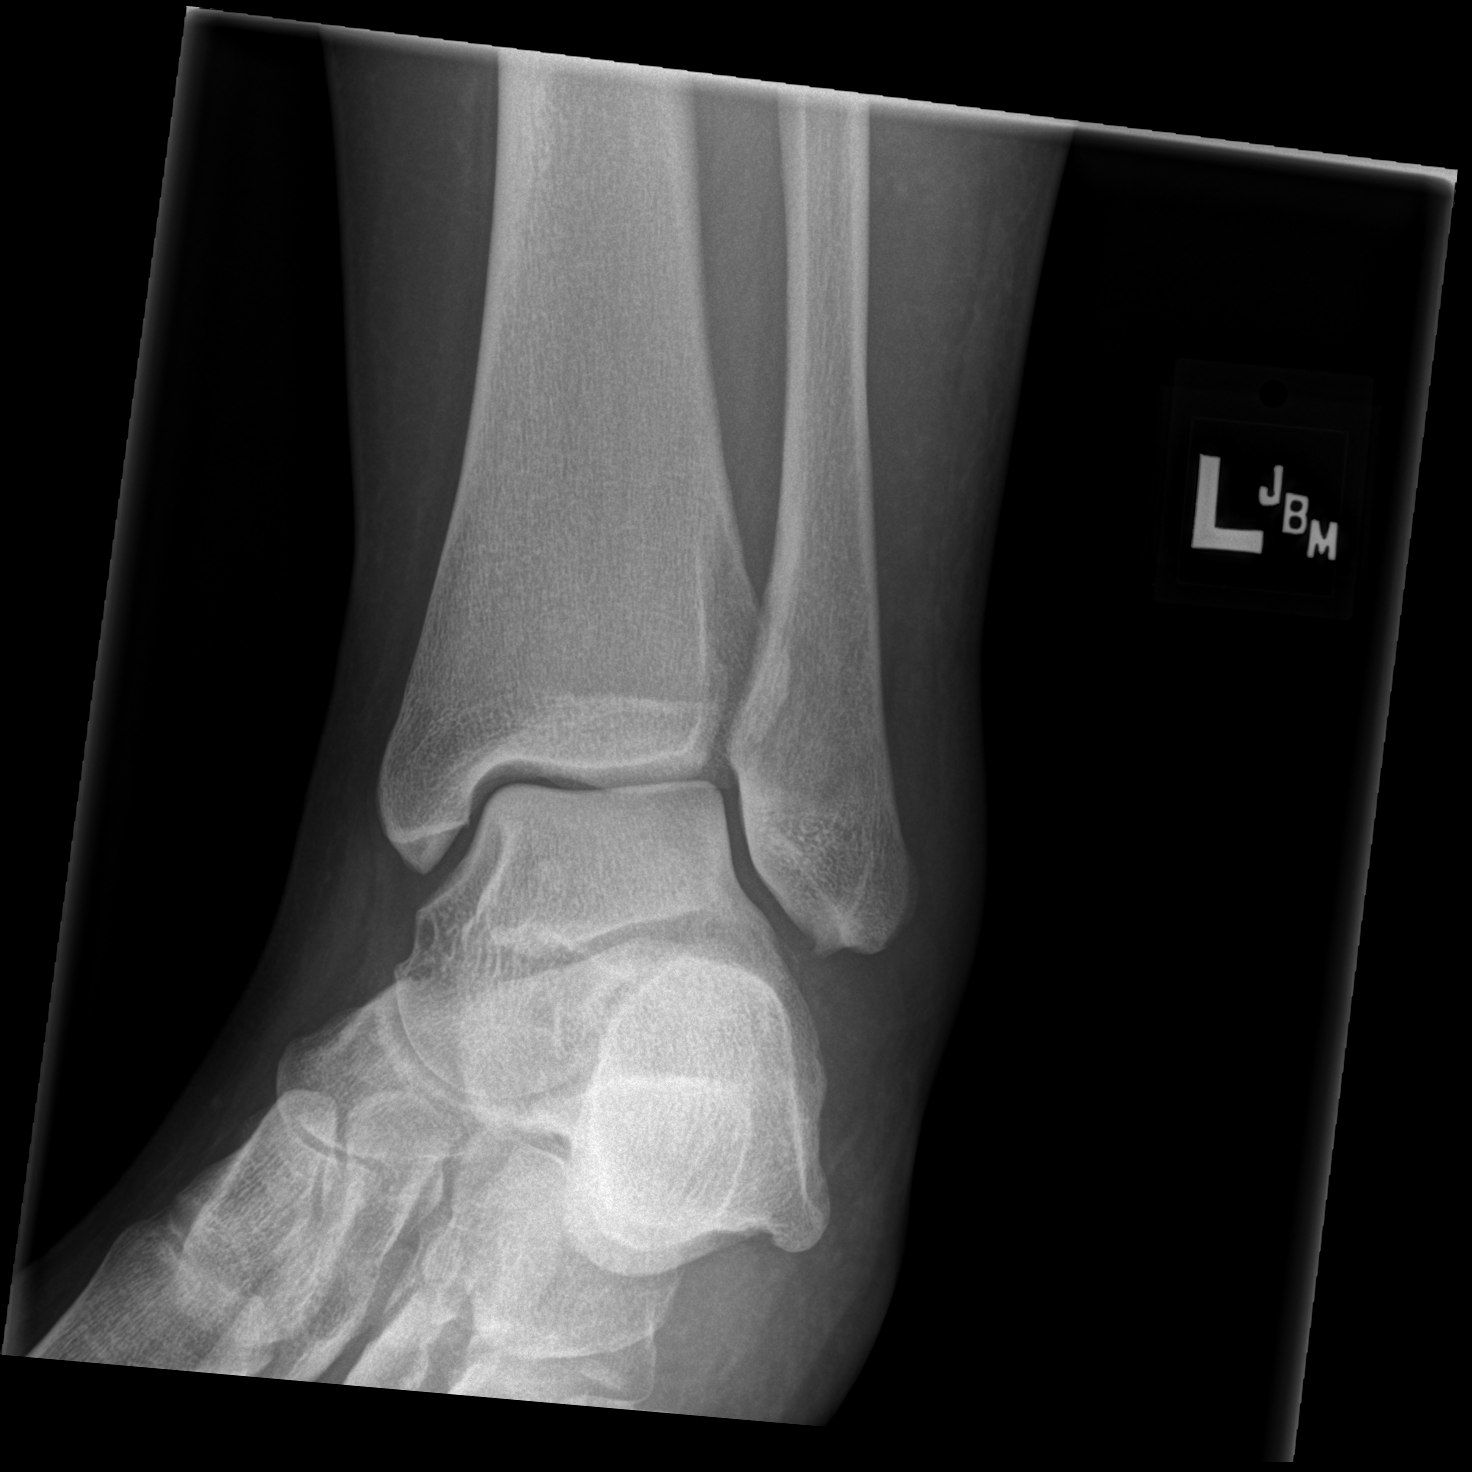

[x ankle lat left]
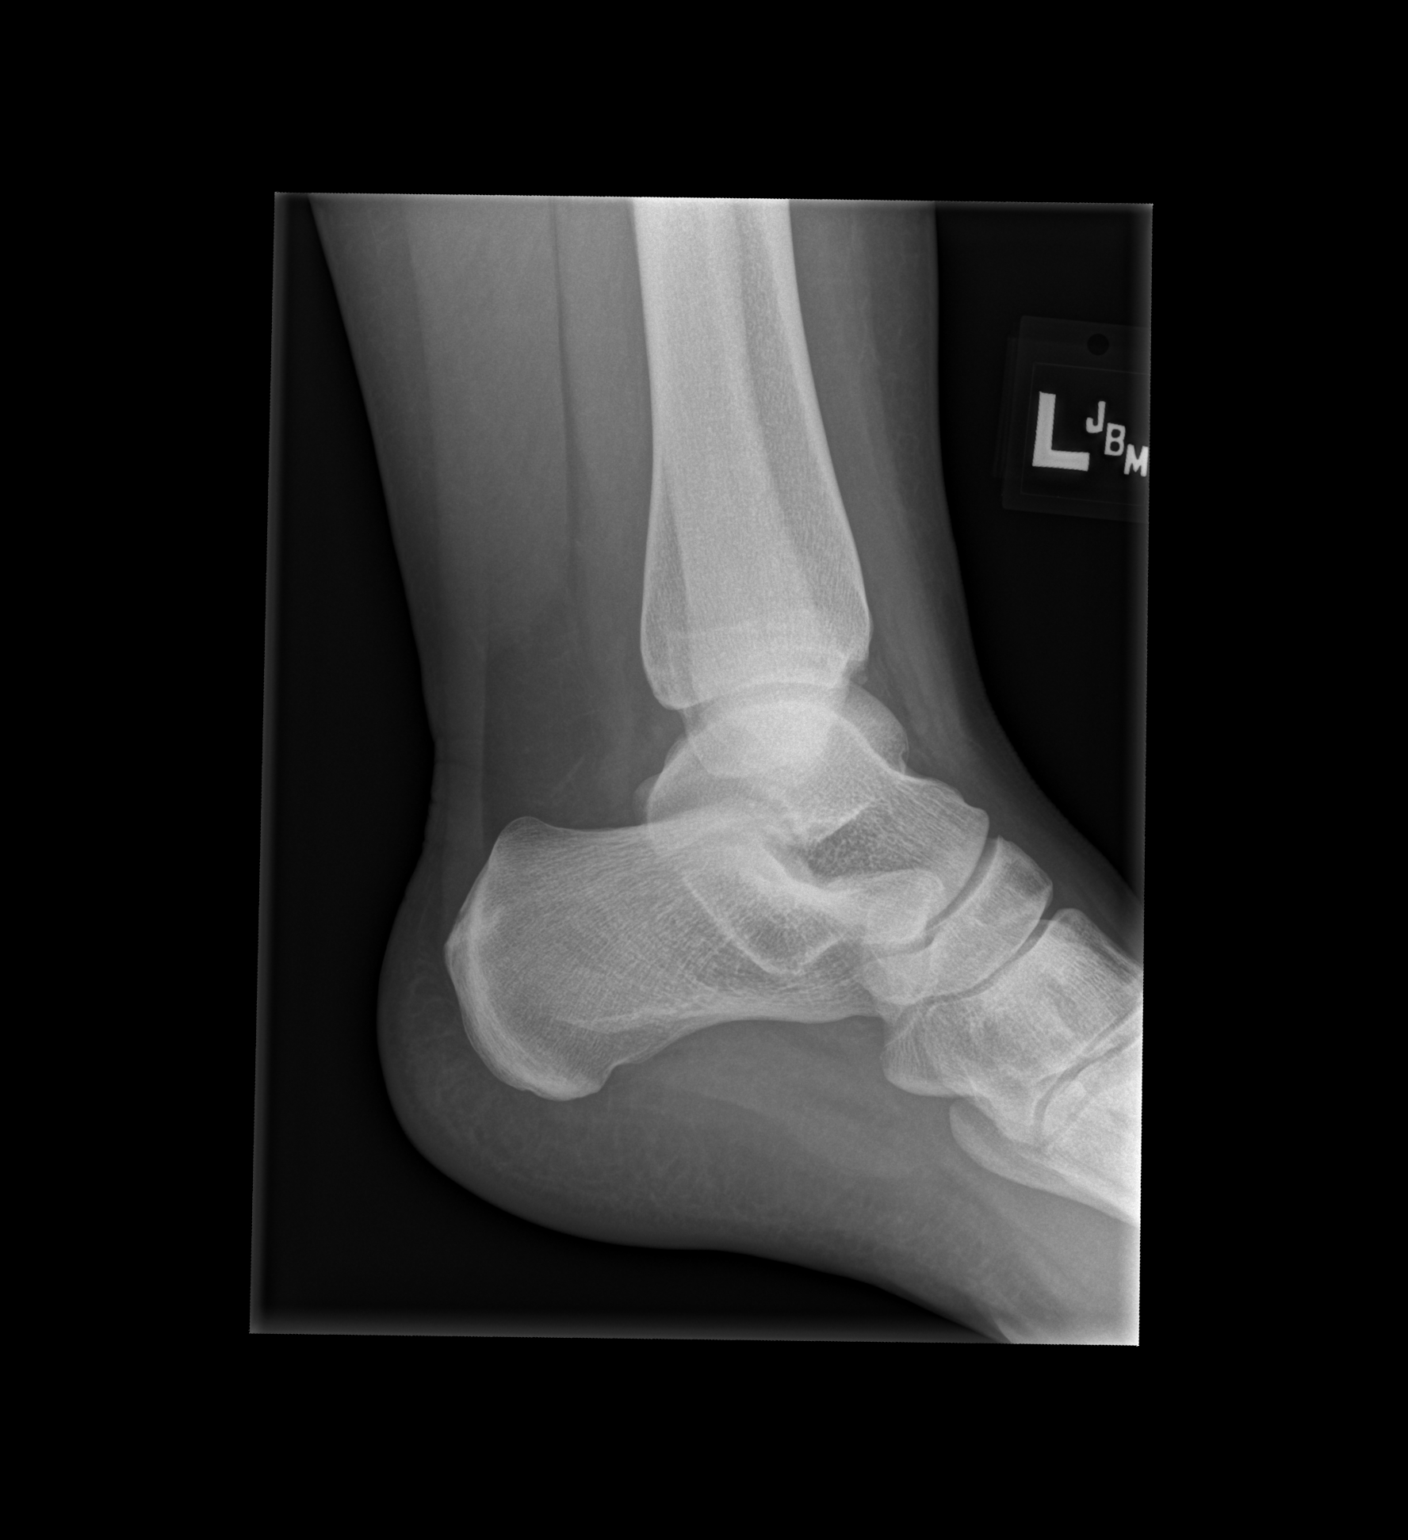

[3 of 3 positions shown; findings below may reference images not displayed]

FINDINGS: Soft tissue swelling is noted primarily laterally. No acute fracture
or dislocation is noted.
IMPRESSION: Soft tissue swelling without acute bony abnormality.

## 2018-10-03 ENCOUNTER — Observation Stay (HOSPITAL_COMMUNITY)
Admission: EM | Admit: 2018-10-03 | Discharge: 2018-10-04 | Disposition: A | Discharge status: Home / Self Care | Payer: BC Managed Care – PPO | Attending: Cardiology | Admitting: Cardiology

## 2018-10-03 ENCOUNTER — Emergency Department (HOSPITAL_COMMUNITY): Payer: BC Managed Care – PPO

## 2018-10-03 ENCOUNTER — Encounter (HOSPITAL_COMMUNITY): Payer: Self-pay | Admitting: Emergency Medicine

## 2018-10-03 ENCOUNTER — Emergency Department (HOSPITAL_BASED_OUTPATIENT_CLINIC_OR_DEPARTMENT_OTHER): Payer: BC Managed Care – PPO

## 2018-10-03 DIAGNOSIS — F17200 Nicotine dependence, unspecified, uncomplicated: Secondary | ICD-10-CM | POA: Diagnosis not present

## 2018-10-03 DIAGNOSIS — I480 Paroxysmal atrial fibrillation: Secondary | ICD-10-CM | POA: Diagnosis not present

## 2018-10-03 DIAGNOSIS — E785 Hyperlipidemia, unspecified: Secondary | ICD-10-CM | POA: Insufficient documentation

## 2018-10-03 DIAGNOSIS — Z1159 Encounter for screening for other viral diseases: Secondary | ICD-10-CM | POA: Insufficient documentation

## 2018-10-03 DIAGNOSIS — Z6841 Body Mass Index (BMI) 40.0 and over, adult: Secondary | ICD-10-CM | POA: Diagnosis not present

## 2018-10-03 DIAGNOSIS — Z79899 Other long term (current) drug therapy: Secondary | ICD-10-CM | POA: Insufficient documentation

## 2018-10-03 DIAGNOSIS — I4891 Unspecified atrial fibrillation: Secondary | ICD-10-CM | POA: Diagnosis not present

## 2018-10-03 DIAGNOSIS — I4430 Unspecified atrioventricular block: Secondary | ICD-10-CM | POA: Diagnosis not present

## 2018-10-03 DIAGNOSIS — F1290 Cannabis use, unspecified, uncomplicated: Secondary | ICD-10-CM | POA: Insufficient documentation

## 2018-10-03 DIAGNOSIS — G43909 Migraine, unspecified, not intractable, without status migrainosus: Secondary | ICD-10-CM | POA: Diagnosis not present

## 2018-10-03 HISTORY — DX: Migraine, unspecified, not intractable, without status migrainosus: G43.909

## 2018-10-03 HISTORY — DX: Unspecified atrial fibrillation: I48.91

## 2018-10-03 LAB — BASIC METABOLIC PANEL
Anion gap: 9 (ref 5–15)
BUN: 9 mg/dL (ref 6–20)
CO2: 22 mmol/L (ref 22–32)
Calcium: 9.2 mg/dL (ref 8.9–10.3)
Chloride: 109 mmol/L (ref 98–111)
Creatinine, Ser: 0.91 mg/dL (ref 0.44–1.00)
GFR calc Af Amer: >60 mL/min (ref >60)
GFR calc non Af Amer: >60 mL/min (ref >60)
Glucose, Bld: 101 mg/dL — ABNORMAL HIGH (ref 70–99)
Potassium: 4.4 mmol/L (ref 3.5–5.1)
Sodium: 140 mmol/L (ref 135–145)

## 2018-10-03 LAB — CBC
HCT: 44.4 % (ref 36.0–46.0)
Hemoglobin: 14.8 g/dL (ref 12.0–15.0)
MCH: 30 pg (ref 26.0–34.0)
MCHC: 33.3 g/dL (ref 30.0–36.0)
MCV: 89.9 fL (ref 80.0–100.0)
Platelets: 480 10*3/uL — ABNORMAL HIGH (ref 150–400)
RBC: 4.94 MIL/uL (ref 3.87–5.11)
RDW: 11.9 % (ref 11.5–15.5)
WBC: 12.4 10*3/uL — ABNORMAL HIGH (ref 4.0–10.5)
nRBC: 0 % (ref 0.0–0.2)

## 2018-10-03 LAB — HEPATIC FUNCTION PANEL
ALT: 27 U/L (ref 0–44)
AST: 23 U/L (ref 15–41)
Albumin: 3.4 g/dL — ABNORMAL LOW (ref 3.5–5.0)
Alkaline Phosphatase: 76 U/L (ref 38–126)
Bilirubin, Direct: <0.1 mg/dL (ref 0.0–0.2)
Total Bilirubin: 0.5 mg/dL (ref 0.3–1.2)
Total Protein: 6.4 g/dL — ABNORMAL LOW (ref 6.5–8.1)

## 2018-10-03 LAB — ECHOCARDIOGRAM COMPLETE

## 2018-10-03 LAB — MRSA PCR SCREENING: MRSA by PCR: NEGATIVE

## 2018-10-03 LAB — I-STAT BETA HCG BLOOD, ED (MC, WL, AP ONLY): I-stat hCG, quantitative: <5 m[IU]/mL (ref <5)

## 2018-10-03 LAB — TROPONIN I (HIGH SENSITIVITY)
Troponin I (High Sensitivity): 4 ng/L (ref <18)
Troponin I (High Sensitivity): 5 ng/L (ref <18)

## 2018-10-03 LAB — MAGNESIUM: Magnesium: 1.9 mg/dL (ref 1.7–2.4)

## 2018-10-03 LAB — SARS CORONAVIRUS 2 BY RT PCR (HOSPITAL ORDER, PERFORMED IN ~~LOC~~ HOSPITAL LAB): SARS Coronavirus 2: NEGATIVE

## 2018-10-03 LAB — TSH: TSH: 1.435 u[IU]/mL (ref 0.350–4.500)

## 2018-10-03 MED ORDER — SERTRALINE HCL 50 MG PO TABS
50.0000 mg | ORAL_TABLET | Freq: Every day | ORAL | Status: DC
  Administered 2018-10-03: 50 mg via ORAL
  Filled 2018-10-03 (×2): qty 1

## 2018-10-03 MED ORDER — SODIUM CHLORIDE 0.9% FLUSH: 3.0000 mL | Freq: Once | INTRAVENOUS | Status: DC

## 2018-10-03 MED ORDER — ALPRAZOLAM 0.25 MG PO TABS: 0.2500 mg | ORAL_TABLET | Freq: Two times a day (BID) | ORAL | Status: DC | PRN

## 2018-10-03 MED ORDER — ACETAMINOPHEN 325 MG PO TABS: 650.0000 mg | ORAL_TABLET | ORAL | Status: DC | PRN

## 2018-10-03 MED ORDER — APIXABAN 5 MG PO TABS
5.0000 mg | ORAL_TABLET | Freq: Two times a day (BID) | ORAL | Status: DC
  Administered 2018-10-03 – 2018-10-04 (×2): 5 mg via ORAL
  Filled 2018-10-03 (×2): qty 1

## 2018-10-03 MED ORDER — MIDAZOLAM HCL 2 MG/2ML IJ SOLN
4.0000 mg | Freq: Once | INTRAMUSCULAR | Status: AC
  Administered 2018-10-03: 14:00:00 4 mg via INTRAVENOUS

## 2018-10-03 MED ORDER — ONDANSETRON HCL 4 MG/2ML IJ SOLN: 4.0000 mg | Freq: Four times a day (QID) | INTRAMUSCULAR | Status: DC | PRN

## 2018-10-03 MED ORDER — ZOLPIDEM TARTRATE 5 MG PO TABS: 5.0000 mg | ORAL_TABLET | Freq: Every evening | ORAL | Status: DC | PRN

## 2018-10-03 MED ORDER — DILTIAZEM HCL-DEXTROSE 100-5 MG/100ML-% IV SOLN (PREMIX)
5.0000 mg/h | INTRAVENOUS | Status: DC
  Filled 2018-10-03: qty 100

## 2018-10-03 MED ORDER — MIDAZOLAM HCL 2 MG/2ML IJ SOLN
INTRAMUSCULAR | Status: AC
  Filled 2018-10-03: qty 2

## 2018-10-03 MED ORDER — APIXABAN 5 MG PO TABS
5.0000 mg | ORAL_TABLET | Freq: Two times a day (BID) | ORAL | Status: DC
  Filled 2018-10-03 (×2): qty 1

## 2018-10-03 MED ORDER — DILTIAZEM HCL-DEXTROSE 100-5 MG/100ML-% IV SOLN (PREMIX)
5.0000 mg/h | INTRAVENOUS | Status: DC
  Administered 2018-10-03: 5 mg/h via INTRAVENOUS
  Filled 2018-10-03: qty 100

## 2018-10-03 MED ORDER — DILTIAZEM LOAD VIA INFUSION
20.0000 mg | Freq: Once | INTRAVENOUS | Status: AC
  Administered 2018-10-03: 14:00:00 20 mg via INTRAVENOUS
  Filled 2018-10-03: qty 20

## 2018-10-03 MED ORDER — MIDAZOLAM HCL (PF) 5 MG/ML IJ SOLN: 4.0000 mg | Freq: Once | INTRAMUSCULAR | Status: DC

## 2018-10-03 MED ORDER — FLECAINIDE ACETATE 100 MG PO TABS
300.0000 mg | ORAL_TABLET | Freq: Once | ORAL | Status: AC
  Administered 2018-10-03: 300 mg via ORAL
  Filled 2018-10-03 (×2): qty 3

## 2018-10-03 NOTE — ED Triage Notes (Signed)
Pt state she took her first dose of propanolol for migraines last night and then 2 hours later began to have sob and heart palpations.

## 2018-10-03 NOTE — ED Provider Notes (Addendum)
[facility]   CSN: [id number] Arrival date & time: 10/03/18  [id number]    History   Chief Complaint Chief Complaint  Patient presents with  . Shortness of Breath  . Palpitations    HPI Lori Boyd is a 28 y.o. female.     HPI Patient p/w HA, palpitations.  She has a long Hx of HA and was prescribed propanolol yesterday. First dose was last night approximately 10 PM. About the night the patient developed palpitations, lightheadedness, and these have been persistent since onset. With a history of a family member with atrial fibrillation, she is concerned for this arrhythmia. No focal pain, no syncope, no vomiting, no diarrhea, no appreciable change in her headache.    History reviewed. No pertinent past medical history.  Patient Active Problem List   Diagnosis Date Noted  . 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 Sprain of anterior talofibular ligament of left ankle 11/24/2016    Past Surgical History:  Procedure Laterality Date  . left ankle reconstruction Left 01/03/2017   Dr. Lajoyce Cornersuda     OB History   No obstetric history on file.      Home Medications    Prior to Admission medications   Medication Sig Start Date End Date Taking? Authorizing Provider  ALPRAZolam (XANAX) 0.25 MG tablet Take 0.25 mg by mouth 2 (two) times daily as needed for anxiety. 04/19/18  Yes [provider]  propranolol (INDERAL) 80 MG tablet Take 80 mg by mouth at bedtime.   Yes [provider]  sertraline (ZOLOFT) 100 MG tablet Take 50 mg by mouth daily. 07/10/18  Yes [provider]    Family History No family history on file.  Social History Social History   Tobacco Use  . Smoking status: Current Some Day Smoker  . Smokeless tobacco: Never Used  Substance Use Topics  . Alcohol use: Yes  . Drug use: Yes    Types: Marijuana     Allergies   Patient has no known allergies.    Review of Systems Review of Systems  Constitutional:       Per HPI, otherwise negative  HENT:       Per HPI, otherwise negative  Respiratory:       Per HPI, otherwise negative  Cardiovascular:       Per HPI, otherwise negative  Gastrointestinal: Negative for vomiting.  Endocrine:       Negative aside from HPI  Genitourinary:       Neg aside from HPI   Musculoskeletal:       Per HPI, otherwise negative  Skin: Negative.   Neurological: Positive for headaches. Negative for syncope.     Physical Exam Updated Vital Signs BP 101/64 (BP Location: Left Arm)   Pulse (!) 52   Temp 97.8 F (36.6 C) (Oral)   Resp 18   SpO2 98%   Physical Exam Vitals signs and nursing note reviewed.  Constitutional:      General: She is not in acute distress.    Appearance: She is well-developed. She is obese.  HENT:     Head: Normocephalic and atraumatic.  Eyes:     Conjunctiva/sclera: Conjunctivae normal.  Cardiovascular:     Rate and Rhythm: Normal rate and regular rhythm.  Pulmonary:     Effort: Pulmonary effort is normal. No respiratory distress.     Breath sounds: Normal breath sounds. No stridor.  Abdominal:     General:  There is no distension.  Skin:    General: Skin is warm and dry.  Neurological:     Mental Status: She is alert and oriented to person, place, and time.     Cranial Nerves: No cranial nerve deficit.      ED Treatments / Results  Labs (all labs ordered are listed, but only abnormal results are displayed) Labs Reviewed  BASIC METABOLIC PANEL - Abnormal; Notable for the following components:      Result Value   Glucose, Bld 101 (*)    All other components within normal limits  CBC - Abnormal; Notable for the following components:   WBC 12.4 (*)    Platelets 480 (*)    All other components within normal limits  SARS CORONAVIRUS 2 (HOSPITAL ORDER, [facility])  TROPONIN I (HIGH SENSITIVITY)  TROPONIN I (HIGH SENSITIVITY)  I-STAT  BETA HCG BLOOD, ED ([facility], [facility], [facility] ONLY)    EKG EKG Interpretation  Date/Time:  Wednesday October 03 2018 12:59:16 EDT Ventricular Rate:  86 PR Interval:    QRS Duration: 96 QT Interval:  344 QTC Calculation: 412 R Axis:   51 Text Interpretation:  Atrial fibrillation Borderline Q waves in inferior leads Abnormal ECG Confirmed by Carmin Muskrat [phone]) on 10/03/2018 1:13:17 PM   Radiology Dg Chest 2 View  Result Date: 10/03/2018 CLINICAL DATA:  Onset shortness of breath and chest pain today. EXAM: CHEST - 2 VIEW COMPARISON:  None. FINDINGS: Lungs clear. Heart size normal. No pneumothorax or pleural fluid. No bony abnormality. IMPRESSION: Negative chest. Electronically Signed   By: Inge Rise M.D.   On: 10/03/2018 10:47  12:30 PM     Procedures .Cardioversion  Date/Time: 10/03/2018 3:16 PM Performed by: Carmin Muskrat, MD Authorized by: Carmin Muskrat, MD   Consent:    Consent obtained:  Verbal   Consent given by:  Patient   Alternatives discussed:  No treatment, rate-control medication and alternative treatment Universal protocol:    Procedure explained and questions answered to patient or proxy's satisfaction: yes     Relevant documents present and verified: yes     Test results available and properly labeled: yes     Immediately prior to procedure a time out was called: yes     Patient identity confirmed:  Verbally with patient Pre-procedure details:    Cardioversion basis:  Emergent   Rhythm:  Atrial fibrillation Patient sedated: Yes. Refer to sedation procedure documentation for details of sedation.  Attempt one:    Cardioversion mode:  Synchronous   Waveform:  Biphasic   Shock (Joules):  120   Shock outcome:  No change in rhythm Post-procedure details:    Patient status:  Awake   Patient tolerance of procedure:  Tolerated well, no immediate complications   (including critical care time)  Medications Ordered in ED Medications  sodium chloride flush (NS)  0.9 % injection 3 mL (3 mLs Intravenous Not Given 10/03/18 1038)     Initial Impression / Assessment and Plan / ED Course  I have reviewed the triage vital signs and the nursing notes.  Pertinent labs & imaging results that were available during my care of the patient were reviewed by me and considered in my medical decision making (see chart for details).   12:30 PM Patient in no distress, awake, alert.  The patient's case with our electrophysiology colleagues given concern for new A. fib.   3:17 PM Patient tolerated attempted cardioversion without complication, though without conversion to  sinus rhythm. With persistent atrial fibrillation, new, the patient will start Cardizem.  This previously well young female with no history of atrial fibrillation, but with headaches, and initiation of propanolol presents after onset of palpitations approximately 12 hours prior to ED arrival. Patient found to have atrial fibrillation, labs, x-ray otherwise reassuring.  Given new arrhythmia, patient started Cardizem bolus, then drip, and after discussion with cardiology colleagues will be admitted for further monitoring, management.  This patients CHA2DS2-VASc Score and unadjusted Ischemic Stroke Rate (% per year) is equal to 0.6 % stroke rate/year from a score of 1  Above score calculated as 1 point each if present [CHF, HTN, DM, Vascular=MI/PAD/Aortic Plaque, Age if 65-74, or Female] Above score calculated as 2 points each if present [Age > 75, or Stroke/TIA/TE]    CRITICAL CARE Performed by: Gerhard Munchobert Jyaire Koudelka Total critical care time: 40 minutes Critical care time was exclusive of separately billable procedures and treating other patients. Critical care was necessary to treat or prevent imminent or life-threatening deterioration. Critical care was time spent personally by me on the following activities: development of treatment plan with patient and/or surrogate as well as nursing, discussions  with consultants, evaluation of patient's response to treatment, examination of patient, obtaining history from patient or surrogate, ordering and performing treatments and interventions, ordering and review of laboratory studies, ordering and review of radiographic studies, pulse oximetry and re-evaluation of patient's condition.    Final Clinical Impressions(s) / ED Diagnoses   Final diagnoses:  New onset a-fib [facility](HCC)   Gerhard MunchLockwood, Varetta Chavers, MD 10/03/18 1131    Gerhard MunchLockwood, Arren Laminack, MD 10/03/18 1520

## 2018-10-03 NOTE — ED Notes (Signed)
Covid sent to lab

## 2018-10-03 NOTE — ED Notes (Signed)
Report given to rn on 2cc

## 2018-10-03 NOTE — ED Notes (Signed)
C/o fluttering feeling in her chest 2 hours after taking 80mg  of propranolol last pm

## 2018-10-03 NOTE — Progress Notes (Signed)
Respiratory in for standby of cardioversion. No noted respiratory difficulties at this time.

## 2018-10-03 NOTE — ED Notes (Signed)
No chest pain some sob  On room air sats are 99%

## 2018-10-03 NOTE — Progress Notes (Signed)
ANTICOAGULATION CONSULT NOTE - Initial Consult  Pharmacy Consult for Eliquis Indication: atrial fibrillation  No Known Allergies  Patient Measurements: Height: 6' (182.9 cm) Weight: 298 lb (135.2 kg) IBW/kg (Calculated) : 73.1   Vital Signs: Temp: 97.8 F (36.6 C) (07/08 0949) Temp Source: Oral (07/08 0949) BP: 121/96 (07/08 1630) Pulse Rate: 71 (07/08 1630)  Labs: Recent Labs    10/03/18 0957 10/03/18 1247  HGB 14.8  --   HCT 44.4  --   PLT 480*  --   CREATININE 0.91  --   TROPONINIHS 4 5    Estimated Creatinine Clearance: 146.1 mL/min (by C-G formula based on SCr of 0.91 mg/dL).   Medical History: Past Medical History:  Diagnosis Date  . Atrial fibrillation ([place]) 10/03/2018  . Migraine     Assessment: Pt is a 27YOF presenting to ED w/ c/o SOB and heart palpitations which started last night about 2 hours after taking propranolol. Pt found to be in A fib. Pt had cardioversion in the ED and failed. Pharmacy to dose Eliquis. Hgb 14.8 and Plts 480.  Goal of Therapy:  appropriate anticoagulation Monitor platelets by anticoagulation protocol: Yes   Plan:  Eliquis 5mg  PO BID w/ or w/o food Monitor s/sx of bleeding F/u for possibility of repeating cardioversion  Jaston Havens 10/03/2018,5:05 PM

## 2018-10-03 NOTE — ED Notes (Signed)
Up to br with assistace

## 2018-10-03 NOTE — Progress Notes (Signed)
  Echocardiogram 2D Echocardiogram has been performed.  Lori Boyd 10/03/2018, 5:43 PM

## 2018-10-03 NOTE — H&P (Addendum)
Cardiology Consultation:   Patient ID: Lori Boyd MRN: [id number]; DOB: 1990/10/20  Admit date: 10/03/2018 Date of Consult: 10/03/2018  Primary Care Provider: Patient, No Pcp Per Primary Cardiologist: No primary care provider on file. new Dr. Daiva NakayamaJ. Petrona Wyeth  Primary Electrophysiologist:  None   Chief complaint:  Palpitations.   Patient Profile:   Lori Boyd is a 28 y.o. female with a hx of migraines who is being seen today for the evaluation of atrial fib at the request of Dr. Jeraldine LootsLockwood.  History of Present Illness:   Lori Boyd was in her usual state of health and took inderal, new medication for her for migraines and developed palpitations, 2 hours after taking propranolol.  She came to ER this AM and EKG is a fib.        EKG:  The EKG was personally reviewed and demonstrates:  Atrial fib rate controlled in the 80, small Q waves in inf leads,  Telemetry:  Telemetry was personally reviewed and demonstrates:  Atrial fib  Troponin HS 4-5 Na 140, K+ 4.4, Cr 0.91  WBC 12.4 Hgb 14.8 plts 480 Neg pregnancy  COVID neg CXR 2V neg  In ER she has been given versed for DCCV but did not convert.  IV dilt was started.    She does use marijuana and ETOH once a week with marijuana to help sleep and ETOH more of a lot at once.  Last drank 4 days ago.  No OTC except Flonase.    Currently BP is 93/63 had been ? Elevated at one point 125/101 not sure if this is correct.  No pain just feels the palpitations.  She has had similar feelings in past but only lasted a couple of hours.     Heart Pathway Score:  0 no chest pain     Past Medical History:  Diagnosis Date   Migraine     Past Surgical History:  Procedure Laterality Date   left ankle reconstruction Left 01/03/2017   Dr. Lajoyce Cornersuda     Home Medications:  Prior to Admission medications   Medication Sig Start Date End Date Taking? Authorizing Provider  ALPRAZolam (XANAX) 0.25 MG tablet Take 0.25 mg by mouth 2 (two) times daily  as needed for anxiety. 04/19/18  Yes [provider]  propranolol (INDERAL) 80 MG tablet Take 80 mg by mouth at bedtime.   Yes [provider]  sertraline (ZOLOFT) 100 MG tablet Take 50 mg by mouth daily. 07/10/18  Yes [provider]    Inpatient Medications: Scheduled Meds:  sodium chloride flush  3 mL Intravenous Once   Continuous Infusions:  diltiazem (CARDIZEM) infusion 5 mg/hr (10/03/18 1435)   PRN Meds:   Allergies:   No Known Allergies  Social History:   Social History   Socioeconomic History   Marital status: Married    Spouse name: Not on file   Number of children: Not on file   Years of education: Not on file   Highest education level: Not on file  Occupational History   Not on file  Social Needs   Financial resource strain: Not on file   Food insecurity    Worry: Not on file    Inability: Not on file   Transportation needs    Medical: Not on file    Non-medical: Not on file  Tobacco Use   Smoking status: Current Some Day Smoker   Smokeless tobacco: Never Used  Substance and Sexual Activity   Alcohol use: Yes  Drug use: Yes    Types: Marijuana   Sexual activity: Not on file  Lifestyle   Physical activity    Days per week: Not on file    Minutes per session: Not on file   Stress: Not on file  Relationships   Social connections    Talks on phone: Not on file    Gets together: Not on file    Attends religious service: Not on file    Active member of club or organization: Not on file    Attends meetings of clubs or organizations: Not on file    Relationship status: Not on file   Intimate partner violence    Fear of current or ex partner: Not on file    Emotionally abused: Not on file    Physically abused: Not on file    Forced sexual activity: Not on file  Other Topics Concern   Not on file  Social History Narrative   Not on file    Family History:    Family History  Problem Relation Age of  Onset   Healthy Mother    Atrial fibrillation Father    Migraines Sister      ROS:  Please see the history of present illness.  General:no colds or fevers, no weight changes Skin:no rashes or ulcers HEENT:no blurred vision, no congestion CV:see HPI PUL:see HPI GI:no diarrhea constipation or melena, no indigestion GU:no hematuria, no dysuria MS:no joint pain, no claudication Neuro:no syncope, no lightheadedness Endo:no diabetes, no thyroid disease GYN  LMP 2 weeks ago  All other ROS reviewed and negative.     Physical Exam/Data:   Vitals:   10/03/18 1445 10/03/18 1515 10/03/18 1520 10/03/18 1530  BP: 93/63 121/62 (!) 151/62 95/65  Pulse: 75 81 81   Resp: 19 14 15 18   Temp:      TempSrc:      SpO2: 98% 99% 99%    No intake or output data in the 24 hours ending 10/03/18 1639 Last 3 Weights 05/21/2018 02/14/2017 11/09/2016  Weight (lbs) 298 lb 298 lb 298 lb  Weight (kg) 135.172 kg 135.172 kg 135.172 kg     There is no height or weight on file to calculate BMI.  General:  Well nourished, well developed, in no acute distress HEENT: normal Lymph: no adenopathy Neck: no JVD Endocrine:  No thryomegaly Vascular: No carotid bruits; pedal pulses 2+ bilaterally  Cardiac:  irreg irreg; no murmur gallup rub or click Lungs:  clear to auscultation bilaterally, no wheezing, rhonchi or rales  Abd: soft, nontender, no hepatomegaly  Ext: no edema Musculoskeletal:  No deformities, BUE and BLE strength normal and equal Skin: warm and dry  Neuro:  Alert and oriented X 3 MAE follows commands no focal abnormalities noted Psych:  Normal affect     Relevant CV Studies: none  Laboratory Data:  High Sensitivity Troponin:   Recent Labs  Lab 10/03/18 0957 10/03/18 1247  TROPONINIHS 4 5     Cardiac EnzymesNo results for input(s): TROPONINI in the last 168 hours. No results for input(s): TROPIPOC in the last 168 hours.  Chemistry Recent Labs  Lab 10/03/18 0957  NA 140  K 4.4    CL 109  CO2 22  GLUCOSE 101*  BUN 9  CREATININE 0.91  CALCIUM 9.2  GFRNONAA >60  GFRAA >60  ANIONGAP 9    No results for input(s): PROT, ALBUMIN, AST, ALT, ALKPHOS, BILITOT in the last 168 hours. Hematology Recent Labs  Lab  10/03/18 0957  WBC 12.4*  RBC 4.94  HGB 14.8  HCT 44.4  MCV 89.9  MCH 30.0  MCHC 33.3  RDW 11.9  PLT 480*   BNPNo results for input(s): BNP, PROBNP in the last 168 hours.  DDimer No results for input(s): DDIMER in the last 168 hours.   Radiology/Studies:  Dg Chest 2 View  Result Date: 10/03/2018 CLINICAL DATA:  Onset shortness of breath and chest pain today. EXAM: CHEST - 2 VIEW COMPARISON:  None. FINDINGS: Lungs clear. Heart size normal. No pneumothorax or pleural fluid. No bony abnormality. IMPRESSION: Negative chest. Electronically Signed   By: Inge Rise M.D.   On: 10/03/2018 10:47    Assessment and Plan:   1. Atrial fib new-  Developed 2 hours after taking propranolol - TSH pending.  Rate is controlled.  Plan for DCCV.   CHA2DS2VASC is 1- hx of palpitations in past but have only lasted about 2 hours.   Dr. Percival Spanish to see.  Admit, echo  TSH pending.  ? holiday heart Will defer to Dr. Percival Spanish for anticoagulation if planning for repeat DCCV - will try flecainide now, admit and begin eliquis.   2.   Migraines       For questions or updates, please contact [place] Please consult [email or web address] for contact info under     Signed, Cecilie Kicks, NP  10/03/2018 4:39 PM    History and all data above reviewed.  Patient examined.  I agree with the findings as above.  The patient has a history of palpitations.  She was actually just started on propranolol for these.  She is never been documented to have atrial fibrillation.  She did have palpitations starting at midnight.  She is getting ready to go to bed.  She feels her heart racing and skipping.  There is a little discomfort associated with this.  She feels uneasy.  Because it did not  stop like it has typically after an hour or so she came to the emergency room.  She was noted to be in atrial fibrillation as above.  She did not convert however with cardioversion.  She is otherwise been feeling well.  She does drink some alcohol but is been about 4 days.  She does not otherwise have any clear trigger.  She can be active at times.  She is a caregiver.  He denies any chest pressure, neck or arm discomfort with her usual activities.  She has not had presyncope or syncope.  She has no shortness of breath, PND or orthopnea.  She has had a significant weight gain since having ankle injury.  The patient exam reveals COR: Irregular, no murmurs,  Lungs: Clear,  Abd: Positive bowel sounds, no rebound, no guarding, Ext remedies no edema.  All available labs, radiology testing, previous records reviewed. Agree with documented assessment and plan.  Atrial fibrillation: The patient has had an echo and has normal left ventricular function.  Some of the images are slightly suboptimal with little bit of difficulty seeing the apex.  However, overall this appears normal and we are awaiting the final results.  I think she be a reasonable candidate for flecainide bolus orally to see if she converts overnight between this and Cardizem.  If not she will likely need another cardioversion as she does feel symptomatic still with this.  We will give her Eliquis which I do not think we have to use long-term as she has a low embolic risk.  We will work  on alcohol consumption.  Weight loss would also help.  She might be able to use flecainide pill in pocket if this helps.  Fayrene FearingJames Jewelz Ricklefs  6:09 PM  10/03/2018

## 2018-10-04 ENCOUNTER — Encounter (HOSPITAL_COMMUNITY): Payer: Self-pay | Admitting: Cardiology

## 2018-10-04 DIAGNOSIS — E785 Hyperlipidemia, unspecified: Secondary | ICD-10-CM

## 2018-10-04 DIAGNOSIS — Z7901 Long term (current) use of anticoagulants: Secondary | ICD-10-CM

## 2018-10-04 DIAGNOSIS — Z79899 Other long term (current) drug therapy: Secondary | ICD-10-CM | POA: Diagnosis not present

## 2018-10-04 DIAGNOSIS — I480 Paroxysmal atrial fibrillation: Secondary | ICD-10-CM | POA: Diagnosis not present

## 2018-10-04 DIAGNOSIS — F17200 Nicotine dependence, unspecified, uncomplicated: Secondary | ICD-10-CM | POA: Diagnosis not present

## 2018-10-04 DIAGNOSIS — Z1159 Encounter for screening for other viral diseases: Secondary | ICD-10-CM | POA: Diagnosis not present

## 2018-10-04 DIAGNOSIS — I4891 Unspecified atrial fibrillation: Secondary | ICD-10-CM | POA: Diagnosis not present

## 2018-10-04 HISTORY — DX: Long term (current) use of anticoagulants: Z79.01

## 2018-10-04 HISTORY — DX: Hyperlipidemia, unspecified: E78.5

## 2018-10-04 HISTORY — DX: Morbid (severe) obesity due to excess calories: E66.01

## 2018-10-04 LAB — CBC
HCT: 38 % (ref 36.0–46.0)
Hemoglobin: 12.9 g/dL (ref 12.0–15.0)
MCH: 30.1 pg (ref 26.0–34.0)
MCHC: 33.9 g/dL (ref 30.0–36.0)
MCV: 88.6 fL (ref 80.0–100.0)
Platelets: 334 10*3/uL (ref 150–400)
RBC: 4.29 MIL/uL (ref 3.87–5.11)
RDW: 12.1 % (ref 11.5–15.5)
WBC: 10.8 10*3/uL — ABNORMAL HIGH (ref 4.0–10.5)
nRBC: 0 % (ref 0.0–0.2)

## 2018-10-04 LAB — LIPID PANEL
Cholesterol: 196 mg/dL (ref 0–200)
HDL: 40 mg/dL — ABNORMAL LOW (ref >40)
LDL Cholesterol: 101 mg/dL — ABNORMAL HIGH (ref 0–99)
Total CHOL/HDL Ratio: 4.9 RATIO
Triglycerides: 277 mg/dL — ABNORMAL HIGH (ref <150)
VLDL: 55 mg/dL — ABNORMAL HIGH (ref 0–40)

## 2018-10-04 LAB — BASIC METABOLIC PANEL
Anion gap: 8 (ref 5–15)
BUN: 10 mg/dL (ref 6–20)
CO2: 20 mmol/L — ABNORMAL LOW (ref 22–32)
Calcium: 8.7 mg/dL — ABNORMAL LOW (ref 8.9–10.3)
Chloride: 109 mmol/L (ref 98–111)
Creatinine, Ser: 0.79 mg/dL (ref 0.44–1.00)
GFR calc Af Amer: >60 mL/min (ref >60)
GFR calc non Af Amer: >60 mL/min (ref >60)
Glucose, Bld: 93 mg/dL (ref 70–99)
Potassium: 3.7 mmol/L (ref 3.5–5.1)
Sodium: 137 mmol/L (ref 135–145)

## 2018-10-04 LAB — HIV ANTIBODY (ROUTINE TESTING W REFLEX): HIV Screen 4th Generation wRfx: NONREACTIVE

## 2018-10-04 MED ORDER — FLECAINIDE ACETATE 100 MG PO TABS
300.0000 mg | ORAL_TABLET | Freq: Once | ORAL | Status: DC | PRN
  Filled 2018-10-04: qty 3

## 2018-10-04 MED ORDER — FLECAINIDE ACETATE 150 MG PO TABS: 300.0000 mg | ORAL_TABLET | Freq: Once | ORAL | 3 refills | Status: AC | PRN

## 2018-10-04 NOTE — Progress Notes (Signed)
Progress Note  Patient Name: Lori OldsEllinor Nikkel Date of Encounter: 10/04/2018  Primary Cardiologist:   No primary care provider on file.   Subjective   Feels well.  Palpitations resolved.  No pain  Inpatient Medications    Scheduled Meds: . apixaban  5 mg Oral BID  . sertraline  50 mg Oral Daily  . sodium chloride flush  3 mL Intravenous Once   Continuous Infusions: . diltiazem (CARDIZEM) infusion Stopped (10/04/18 0109)   PRN Meds: acetaminophen, ALPRAZolam, ondansetron (ZOFRAN) IV, zolpidem   Vital Signs    Vitals:   10/04/18 0000 10/04/18 0100 10/04/18 0326 10/04/18 0732  BP: 114/63 101/65 (!) 113/58 115/69  Pulse: 98 66 71   Resp: 14 14 13    Temp:   98.1 F (36.7 C) 98.1 F (36.7 C)  TempSrc:   Oral Oral  SpO2: 96% 96% 100%   Weight:      Height:        Intake/Output Summary (Last 24 hours) at 10/04/2018 0734 Last data filed at 10/03/2018 2000 Gross per 24 hour  Intake 27.04 ml  Output -  Net 27.04 ml   Filed Weights   10/03/18 1700 10/03/18 2009  Weight: 135.2 kg (!) 174.5 kg    Telemetry    NSR - Personally Reviewed  ECG    NSR, rate 62, axis WNL, early transition.  Borderline first degree AV block.  No acute ST T wave changes. - Personally Reviewed  Physical Exam   GEN: No acute distress.   Neck: No  JVD Cardiac: RRR, no murmurs, rubs, or gallops.  Respiratory: Clear  to auscultation bilaterally. GI: Soft, nontender, non-distended  MS: No  edema; No deformity. Neuro:  Nonfocal  Psych: Normal affect   Labs    Chemistry Recent Labs  Lab 10/03/18 0957 10/03/18 2144 10/04/18 0315  NA 140  --  137  K 4.4  --  3.7  CL 109  --  109  CO2 22  --  20*  GLUCOSE 101*  --  93  BUN 9  --  10  CREATININE 0.91  --  0.79  CALCIUM 9.2  --  8.7*  PROT  --  6.4*  --   ALBUMIN  --  3.4*  --   AST  --  23  --   ALT  --  27  --   ALKPHOS  --  76  --   BILITOT  --  0.5  --   GFRNONAA >60  --  >60  GFRAA >60  --  >60  ANIONGAP 9  --  8      Hematology Recent Labs  Lab 10/03/18 0957 10/04/18 0315  WBC 12.4* 10.8*  RBC 4.94 4.29  HGB 14.8 12.9  HCT 44.4 38.0  MCV 89.9 88.6  MCH 30.0 30.1  MCHC 33.3 33.9  RDW 11.9 12.1  PLT 480* 334    Cardiac EnzymesNo results for input(s): TROPONINI in the last 168 hours. No results for input(s): TROPIPOC in the last 168 hours.   BNPNo results for input(s): BNP, PROBNP in the last 168 hours.   DDimer No results for input(s): DDIMER in the last 168 hours.   Radiology    Dg Chest 2 View  Result Date: 10/03/2018 CLINICAL DATA:  Onset shortness of breath and chest pain today. EXAM: CHEST - 2 VIEW COMPARISON:  None. FINDINGS: Lungs clear. Heart size normal. No pneumothorax or pleural fluid. No bony abnormality. IMPRESSION: Negative chest. Electronically Signed  By: Inge Rise M.D.   On: 10/03/2018 10:47    Cardiac Studies   Echo  10/03/18  1. The left ventricle has normal systolic function with an ejection fraction of 60-65%. The cavity size was normal. Left ventricular diastolic function could not be evaluated secondary to atrial fibrillation. No evidence of left ventricular regional  wall motion abnormalities.  2. The right ventricle has normal systolic function. The cavity was normal. There is no increase in right ventricular wall thickness.  Patient Profile     28 y.o. female with palpitations who presents with atrial fib.    Assessment & Plan    ATRIAL FIB:  Converted with flecainide.  She could use Flecainide pill in pocket in the future.  No indication for Eliquis since she converted within 24 hours.    OBESITY:  Educated.    DYSLIPIDEMIA:  Diet educated.  Triglycerides elevated.  Follow up with diet.   For questions or updates, please contact [place] Please consult [email or web address] for contact info under Cardiology/STEMI.   Signed, Minus Breeding, MD  10/04/2018, 7:34 AM

## 2018-10-04 NOTE — Progress Notes (Signed)
Patient received Tambocor at 2234 and about 0100 Patient converted NSR controlled rate 60-70. MD made aware and order received to stop Cardizem gtt. Plan is to continue to monitor.

## 2018-10-04 NOTE — Discharge Instructions (Signed)
Atrial Fibrillation  Atrial fibrillation is a type of heartbeat that is irregular or fast (rapid). If you have this condition, your heart beats without any order. This makes it hard for your heart to pump blood in a normal way. Having this condition gives you more risk for stroke, heart failure, and other heart problems. Atrial fibrillation may start all of a sudden and then stop on its own, or it may become a long-lasting problem. What are the causes? This condition may be caused by heart conditions, such as:  High blood pressure.  Heart failure.  Heart valve disease.  Heart surgery. Other causes include:  Pneumonia.  Obstructive sleep apnea.  Lung cancer.  Thyroid disease.  Drinking too much alcohol. Sometimes the cause is not known. What increases the risk? You are more likely to develop this condition if:  You smoke.  You are older.  You have diabetes.  You are overweight.  You have a family history of this condition.  You exercise often and hard. What are the signs or symptoms? Common symptoms of this condition include:  A feeling like your heart is beating very fast.  Chest pain.  Feeling short of breath.  Feeling light-headed or weak.  Getting tired easily. Follow these instructions at home: Medicines  Take over-the-counter and prescription medicines only as told by your doctor.  If your doctor gives you a blood-thinning medicine, take it exactly as told. Taking too much of it can cause bleeding. Taking too little of it does not protect you against clots. Clots can cause a stroke. Lifestyle      Do not use any tobacco products. These include cigarettes, chewing tobacco, and e-cigarettes. If you need help quitting, ask your doctor.  Do not drink alcohol.  Do not drink beverages that have caffeine. These include coffee, soda, and tea.  Follow diet instructions as told by your doctor.  Exercise regularly as told by your doctor. General  instructions  If you have a condition that causes breathing to stop for a short period of time (apnea), treat it as told by your doctor.  Keep a healthy weight. Do not use diet pills unless your doctor says they are safe for you. Diet pills may make heart problems worse.  Keep all follow-up visits as told by your doctor. This is important. Contact a doctor if:  You notice a change in the speed, rhythm, or strength of your heartbeat.  You are taking a blood-thinning medicine and you see more bruising.  You get tired more easily when you move or exercise.  You have a sudden change in weight. Get help right away if:   You have pain in your chest or your belly (abdomen).  You have trouble breathing.  You have blood in your vomit, poop, or pee (urine).  You have any signs of a stroke. "BE FAST" is an easy way to remember the main warning signs: ? B - Balance. Signs are dizziness, sudden trouble walking, or loss of balance. ? E - Eyes. Signs are trouble seeing or a change in how you see. ? F - Face. Signs are sudden weakness or loss of feeling in the face, or the face or eyelid drooping on one side. ? A - Arms. Signs are weakness or loss of feeling in an arm. This happens suddenly and usually on one side of the body. ? S - Speech. Signs are sudden trouble speaking, slurred speech, or trouble understanding what people say. ? T -  Time. Time to call emergency services. Write down what time symptoms started.  You have other signs of a stroke, such as: ? A sudden, very bad headache with no known cause. ? Feeling sick to your stomach (nausea). ? Throwing up (vomiting). ? Jerky movements you cannot control (seizure). These symptoms may be an emergency. Do not wait to see if the symptoms will go away. Get medical help right away. Call your local emergency services (911 in the [place] Do not drive yourself to the hospital. Summary  Atrial fibrillation is a type of heartbeat that is irregular  or fast (rapid).  You are at higher risk of this condition if you smoke, are older, have diabetes, or are overweight.  Follow your doctor's instructions about medicines, diet, exercise, and follow-up visits.  Get help right away if you think that you have signs of a stroke. This information is not intended to replace advice given to you by your health care provider. Make sure you discuss any questions you have with your health care provider. Document Released: 12/22/2007 Document Revised: 05/18/2017 Document Reviewed: 05/05/2017 Elsevier Patient Education  2020 Elsevier Inc[employer]   Preventing Atrial Fibrillation-Related Stroke  Atrial fibrillation is a common type of irregular or rapid heartbeat (arrhythmia) that greatly increases your risk for a stroke. In atrial fibrillation, the top portions of the heart (atria) beat out of sync with the lower portions of the heart. When the muscles of the atria are tightening in an uncoordinated way (fibrillating), blood can pool in the heart and form clots. If a clot travels to the brain, it can cause a stroke. This type of stroke is preventable. Understanding atrial fibrillation and knowing how to properly manage it can prevent you from having a stroke. What increases my risk for a stroke? If you have atrial fibrillation, you may be at increased risk for a stroke if you also:  Have heart failure.  Have high blood pressure.  Are older than age 26.  Have diabetes.  Have a history of vascular disease, such as heart attack or stroke.  Are female. If you have atrial fibrillation and you also have one or more of those risk factors, talk with your health care provider about treatments that can prevent a stroke. Other risk factors for a stroke include:  Smoking.  High cholesterol.  Diabetes.  Being inactive (sedentary lifestyle).  Having a family history of stroke.  Eating a diet that is high in fat, cholesterol, and salt. What treatments help to  manage atrial fibrillation? The main goals of treatment for atrial fibrillation are to prevent blood clots from forming and to keep your heart beating at a normal rate and rhythm. Treatment may include:  Blood-thinning medicine (anticoagulant) that helps to prevent clots from forming. This medicine also increases the risk of bleeding. Talk with your health care provider about the risks and benefits of taking anticoagulants.  Medicine that slows the heart rate or brings the heart rhythm back to normal.  Electrical cardioversion. This is a procedure that resets the heart's rhythm by delivering a controlled, low-energy shock through your skin to your heart.  An ablation procedure, such as catheter ablation, catheter ablation with pacemaker, or surgical ablation. These procedures destroy the heart tissues that send abnormal signals so that heart rhythms can be improved or made normal. A pacemaker is a device that is placed under the skin to help the heart beat in a regular rhythm. How can I prevent atrial fibrillation-related stroke? Medicines  Take  over-the-counter and prescription medicines only as told by your health care provider.  If your health care provider prescribed an anticoagulant, take it exactly as told. Taking too much blood-thinning medicine can cause bleeding. If you do not take enough blood-thinning medicine, you will not have the protection that you need against stroke and other problems. Eating and drinking  Eat healthy foods, including at least 5 servings of fruits and vegetables a day.  Do not drink alcohol.  Do not drink beverages that contain caffeine, such as coffee, soda, and tea.  Follow dietary instructions as told by your health care provider. Managing other medical conditions  Manage and be aware of your blood pressure. If you have high blood pressure, follow your treatment plan to keep it in your target range.  Have your cholesterol checked as often as  recommended by your health care provider. If you have high cholesterol, follow your treatment plan to lower it and keep it in your target range.  Talk with your health care provider about symptoms to watch for. Some people may not have any symptoms, so it can be hard to know that they have atrial fibrillation. Talk with your health care provider if you experience: ? A feeling that your heart is beating rapidly or irregularly. ? An irregular pulse. ? A feeling of discomfort or pain in your chest. ? Shortness of breath. ? Sudden light-headedness or weakness. ? Tiredness (fatigue) that happens easily during exercise.  If you have obstructive sleep apnea (OSA), manage your condition as told by your health care provider. General instructions  Maintain a healthy weight. Do not use diet pills unless your health care provider approves. Diet pills may make heart problems worse.  Exercise regularly. Get at least 30 minutes of activity on most or all days, or as told by your health care provider.  Do not use any products that contain nicotine or tobacco, such as cigarettes and e-cigarettes. If you need help quitting, ask your health care provider.  Do not use drugs, such as cocaine and amphetamines.  Keep all follow-up visits as told by your health care providers. This is important. These include visits with your heart specialist. Where to find more information You may find more information about preventing atrial fibrillation-related stroke from:  National Stroke Association (AFib-Stroke Connection): www.stroke.org Contact a health care provider if:  You notice a change in the rate, rhythm, or strength of your heartbeat.  You have dizziness.  You are taking an anticoagulant and you have more bruises than usual.  You tire out more easily when you exercise or do similar activities. Get help right away if:   You have chest pain.  You have pain in your abdomen.  You experience unusual  sweating or weakness.  You take anticoagulants and you: ? Have severe headaches or confusion. ? Have blood in your vomit, bowel movement, or urine. ? Have bleeding that will not stop. ? Fall or injure your head.  You have any symptoms of a stroke. "BE FAST" is an easy way to remember the main warning signs of a stroke: ? B - Balance. Signs are dizziness, trouble walking, or loss of balance. ? E - Eyes. Signs are trouble seeing or a sudden change in vision. ? F - Face. Signs are sudden weakness or numbness of the face, or the face or eyelid drooping on one side. ? A - Arms. Signs are weakness or numbness in an arm. This happens suddenly and usually on one side  of the body. ? S - Speech. Signs are sudden trouble speaking, slurred speech, or trouble understanding what people say. ? T - Time. Time to call emergency services. Write down what time symptoms started.  You have other signs of a stroke, such as: ? A sudden, severe headache with no known cause. ? Nausea or vomiting. ? Seizure. These symptoms may represent a serious problem that is an emergency. Do not wait to see if the symptoms will go away. Get medical help right away. Call your local emergency services (911 in the [place] Do not drive yourself to the hospital. Summary  Having atrial fibrillation increases the risk for a stroke. Talk with your health care provider about what symptoms to watch for.  Atrial fibrillation-related stroke is preventable. Proper management of atrial fibrillation can prevent you from having a stroke.  Talk with your health care provider about whether anticoagulant medicine is right for you.  Learn the warning signs of a stroke and remember "BE FAST." This information is not intended to replace advice given to you by your health care provider. Make sure you discuss any questions you have with your health care provider. Document Released: 06/29/2016 Document Revised: 07/09/2018 Document Reviewed:  06/29/2016 Elsevier Patient Education  2020 [employer] If you go back into atrial fib you can use the flecainide take 2 150 mg tabs to see if this will convert you.  Rest after taking.  This is what we used in the hospital to convert you.  We call it a pill in a pocket.    Heart healthy diet.   Your thyroid was normal.  Decrease alcohol intake this may add to risk for atrial fib.  If episodes continue we would look into sleep apnea.   Exercise, eat healthy.

## 2018-10-04 NOTE — Discharge Summary (Addendum)
Discharge Summary    Patient ID: Lori Boyd MRN: [id number]; DOB: February 05, 1991  Admit date: 10/03/2018 Discharge date: 10/04/2018  Primary Care Provider: Patient, No Pcp Per  Primary Cardiologist: Minus Breeding, MD Primary Electrophysiologist:  None   Discharge Diagnoses    Principal Problem:   Atrial fibrillation [facility]) Active Problems:   Morbid obesity ([place])   HLD (hyperlipidemia)   Allergies No Known Allergies  Diagnostic Studies/Procedures    Echo 10/03/18 IMPRESSIONS   1. The left ventricle has normal systolic function with an ejection fraction of 60-65%. The cavity size was normal. Left ventricular diastolic function could not be evaluated secondary to atrial fibrillation. No evidence of left ventricular regional  wall motion abnormalities.  2. The right ventricle has normal systolic function. The cavity was normal. There is no increase in right ventricular wall thickness.  FINDINGS  Left Ventricle: The left ventricle has normal systolic function, with an ejection fraction of 60-65%. The cavity size was normal. There is no increase in left ventricular wall thickness. Left ventricular diastolic function could not be evaluated  secondary to atrial fibrillation. No evidence of left ventricular regional wall motion abnormalities..  Right Ventricle: The right ventricle has normal systolic function. The cavity was normal. There is no increase in right ventricular wall thickness.  Left Atrium: Left atrial size was normal in size.  Right Atrium: Right atrial size was normal in size.  Interatrial Septum: No atrial level shunt detected by color flow Doppler.  Pericardium: There is no evidence of pericardial effusion.  Mitral Valve: The mitral valve is normal in structure. Mitral valve regurgitation is not visualized by color flow Doppler.  Tricuspid Valve: The tricuspid valve is normal in structure. Tricuspid valve regurgitation is trivial by color flow Doppler.   Aortic Valve: The aortic valve is normal in structure. Aortic valve regurgitation is trivial by color flow Doppler.  Pulmonic Valve: The pulmonic valve was normal in structure. Pulmonic valve regurgitation is trivial by color flow Doppler.  Venous: The inferior vena cava was not well visualized. _____________   History of Present Illness     28 y.o. female with a hx of migraines went into a fib about 2 hours after taking propranolol.  She has had what sounds like a fib before but would only last 2 hours.  This episode lasted all night, no pain but could feel irregular heart beat.  She came to ER and found to be in atrial fib.  Rate on EKG was controlled.  She underwent DCCV in ER unsuccessfully with 120 joules.  She was then placed on dilt drip.  Labs were stable no abnromalities.  Echo ordered and Dr. Percival Spanish saw and evaluated.  Admitted to Obs with plan for flecainide 300 mg.  If no conversion plan would be for DCCV the morning of the 9th.      Hospital Course     Consultants: none   Pt rec'd the flecainide at 2234 and then converted around 0100 to SR with HR in the 60-70s.  Her echo was normal and her dilt drip was stopped.    Pt was seen and evaluated by Dr. Percival Spanish today and she is maintaining SR.  Plan to resume propranolol and script given for "pill in the pocket" of flecainide 300 mg to take if she did not come out of a fib.  She converted within 24 hours so no need for anticoagulation.     She is stable for discharge.   Discussed wt loss, eating healthy and  decrease ETOH intake.  If continued episodes thought could be to sleep study.   _____________  Discharge Vitals Blood pressure 115/69, pulse 71, temperature 98.1 F (36.7 C), temperature source Oral, resp. rate 13, height 6\' 2"  (1.88 m), weight (!) 174.5 kg, SpO2 100 %.  Filed Weights   10/03/18 1700 10/03/18 2009  Weight: 135.2 kg (!) 174.5 kg    Labs & Radiologic Studies    CBC Recent Labs    10/03/18 0957  10/04/18 0315  WBC 12.4* 10.8*  HGB 14.8 12.9  HCT 44.4 38.0  MCV 89.9 88.6  PLT 480* 334   Basic Metabolic Panel Recent Labs    30/86/5705/11/14 0957 10/03/18 1800 10/04/18 0315  NA 140  --  137  K 4.4  --  3.7  CL 109  --  109  CO2 22  --  20*  GLUCOSE 101*  --  93  BUN 9  --  10  CREATININE 0.91  --  0.79  CALCIUM 9.2  --  8.7*  MG  --  1.9  --    Liver Function Tests Recent Labs    10/03/18 2144  AST 23  ALT 27  ALKPHOS 76  BILITOT 0.5  PROT 6.4*  ALBUMIN 3.4*   No results for input(s): LIPASE, AMYLASE in the last 72 hours. Cardiac Enzymes No results for input(s): CKTOTAL, CKMB, CKMBINDEX, TROPONINI in the last 72 hours. BNP Invalid input(s): POCBNP D-Dimer No results for input(s): DDIMER in the last 72 hours. Hemoglobin A1C No results for input(s): HGBA1C in the last 72 hours. Fasting Lipid Panel Recent Labs    10/04/18 0315  CHOL 196  HDL 40*  LDLCALC 101*  TRIG 277*  CHOLHDL 4.9   Thyroid Function Tests Recent Labs    10/03/18 1800  TSH 1.435   _____________  Dg Chest 2 View  Result Date: 10/03/2018 CLINICAL DATA:  Onset shortness of breath and chest pain today. EXAM: CHEST - 2 VIEW COMPARISON:  None. FINDINGS: Lungs clear. Heart size normal. No pneumothorax or pleural fluid. No bony abnormality. IMPRESSION: Negative chest. Electronically Signed   By: Drusilla Kannerhomas  Dalessio M.D.   On: 10/03/2018 10:47   Disposition   Pt is being discharged home today in good condition.  Follow-up Plans & Appointments   If you go back into atrial fib you can use the flecainide take 2 150 mg tabs to see if this will convert you.  Rest after taking.  This is what we used in the hospital to convert you.  We call it a pill in a pocket.    Heart healthy diet.   Your thyroid was normal.  Decrease alcohol intake this may add to risk for atrial fib.  If episodes continue we would look into sleep apnea.   Exercise, eat healthy.     Follow-up Information    Rollene RotundaHochrein, Cris Talavera, MD  Follow up on 10/22/2018.   Specialty: Cardiology Why:  at 2:45 pm Dr. Jenene SlickerHochrein's Nurse Practitioner Dr. Joni ReiningKathryn Lawrence Contact information: [address] [place] [id number] [phone]            Discharge Medications   Allergies as of 10/04/2018   No Known Allergies     Medication List    TAKE these medications   ALPRAZolam 0.25 MG tablet Commonly known as: XANAX Take 0.25 mg by mouth 2 (two) times daily as needed for anxiety.   flecainide 150 MG tablet Commonly known as: TAMBOCOR Take 2 tablets (300 mg total)  by mouth once as needed (atrial fib).   propranolol 80 MG tablet Commonly known as: INDERAL Take 80 mg by mouth at bedtime.   sertraline 100 MG tablet Commonly known as: ZOLOFT Take 50 mg by mouth daily.        Acute coronary syndrome (MI, NSTEMI, STEMI, etc) this admission?: No.    Outstanding Labs/Studies   None   Duration of Discharge Encounter   Greater than 30 minutes including physician time.  Signed, Nada BoozerLaura Ingold, NP 10/04/2018, 10:34 AM  Patient seen and examined.  Plan as discussed in my rounding note for today and outlined above. Rollene RotundaJames Kayren Holck  10/04/2018  4:35 PM

## 2018-10-05 LAB — HEMOGLOBIN A1C
Hgb A1c MFr Bld: 5.2 % (ref 4.8–5.6)
Mean Plasma Glucose: 103 mg/dL

## 2018-10-21 NOTE — Progress Notes (Deleted)
Cardiology Office Note   Date:  10/21/2018   ID:  Francesca Strome, DOB 02/13/1991, MRN [id number]  PCP:  Maude Leriche, PA-C  Cardiologist:  Percival Spanish  No chief complaint on file.    History of Present Illness: Lori Boyd is a 28 y.o. female who presents for ongoing assessment and management of atrial fibrillation, posthospitalization follow-up after admission for same.  Apparently the patient has a history of migraines and had atrial fibrillation 2 hours after taking propanolol the patient had a DCCV in the ER which was unsuccessful.  She was placed on a diltiazem drip.  The patient was to be started on flecainide 300 mg daily and it did not convert would have a second cardioversion.  The patient converted to normal sinus rhythm that evening.  Echocardiogram was normal, no evidence of decreased LV systolic function.  She was maintaining normal sinus rhythm on day of discharge on 10/04/2018.  She was to resume propanolol and a prescription was given for pill in the pocket on flecainide 300 mg to take if she had a second episode.  She was not placed on anticoagulation.    Past Medical History:  Diagnosis Date  . Anticoagulation adequate 10/04/2018  . Atrial fibrillation ([place]) 10/03/2018  . HLD (hyperlipidemia) 10/04/2018  . Migraine   . Morbid obesity ([place]) 10/04/2018    Past Surgical History:  Procedure Laterality Date  . left ankle reconstruction Left 01/03/2017   Dr. Sharol Given     Current Outpatient Medications  Medication Sig Dispense Refill  . ALPRAZolam (XANAX) 0.25 MG tablet Take 0.25 mg by mouth 2 (two) times daily as needed for anxiety.    . flecainide (TAMBOCOR) 150 MG tablet Take 2 tablets (300 mg total) by mouth once as needed (atrial fib). 2 tablet 3  . propranolol (INDERAL) 80 MG tablet Take 80 mg by mouth at bedtime.    . sertraline (ZOLOFT) 100 MG tablet Take 50 mg by mouth daily.     No current facility-administered medications for this visit.     Allergies:    Patient has no known allergies.    Social History:  The patient  reports that she has been smoking. She has never used smokeless tobacco. She reports current alcohol use. She reports current drug use. Drug: Marijuana.   Family History:  The patient's family history includes Atrial fibrillation in her father; Healthy in her mother; Migraines in her sister.    ROS: All other systems are reviewed and negative. Unless otherwise mentioned in H&P    PHYSICAL EXAM: VS:  There were no vitals taken for this visit. , BMI There is no height or weight on file to calculate BMI. GEN: Well nourished, well developed, in no acute distress HEENT: normal Neck: no JVD, carotid bruits, or masses Cardiac: ***RRR; no murmurs, rubs, or gallops,no edema  Respiratory:  Clear to auscultation bilaterally, normal work of breathing GI: soft, nontender, nondistended, + BS MS: no deformity or atrophy Skin: warm and dry, no rash Neuro:  Strength and sensation are intact Psych: euthymic mood, full affect   EKG:  EKG {ACTION; IS/IS [id number]} ordered today. The ekg ordered today demonstrates ***   Recent Labs: 10/03/2018: ALT 27; Magnesium 1.9; TSH 1.435 10/04/2018: BUN 10; Creatinine, Ser 0.79; Hemoglobin 12.9; Platelets 334; Potassium 3.7; Sodium 137    Lipid Panel    Component Value Date/Time   CHOL 196 10/04/2018 0315   TRIG 277 (H) 10/04/2018 0315   HDL 40 (L) 10/04/2018 0315   CHOLHDL 4.9  10/04/2018 0315   VLDL 55 (H) 10/04/2018 0315   LDLCALC 101 (H) 10/04/2018 0315      Wt Readings from Last 3 Encounters:  10/03/18 (!) 384 lb 11.2 oz (174.5 kg)  05/21/18 298 lb (135.2 kg)  02/14/17 298 lb (135.2 kg)      Other studies Reviewed: Echocardiogram 10/03/2018 1. The left ventricle has normal systolic function with an ejection fraction of 60-65%. The cavity size was normal. Left ventricular diastolic function could not be evaluated secondary to atrial fibrillation. No evidence of left ventricular  regional  wall motion abnormalities.  2. The right ventricle has normal systolic function. The cavity was normal. There is no increase in right ventricular wall thickness.    ASSESSMENT AND PLAN:  1.  ***   Current medicines are reviewed at length with the patient today.    Labs/ tests ordered today include: *** Bettey MareKathryn M. Liborio NixonLawrence DNP, ANP, AACC   10/21/2018 12:52 PM    [facility] [address] Office [phone] Fax [phone]

## 2018-10-22 ENCOUNTER — Ambulatory Visit: Payer: BC Managed Care – PPO | Admitting: Adult Health

## 2018-10-22 ENCOUNTER — Telehealth: Payer: Self-pay | Admitting: Adult Health

## 2018-10-22 NOTE — Telephone Encounter (Signed)
Pt cx today's appt

## 2018-10-29 NOTE — Progress Notes (Signed)
Cardiology Office Note   Date:  10/31/2018   ID:  Wonda Oldsllinor Urbanek, DOB 1990-12-04, MRN [id number]  PCP:  Clementeen GrahamScifres, Dorothy, PA-C  Cardiologist:  Dr. Antoine PocheHochrein  CC: PAF    History of Present Illness: Lori Boyd is a 28 y.o. female who presents for posthospitalization follow-up after admission for atrial fib, presented to the emergency room after taking propanolol approximately 2 hours prior to arrival.  She stated that the episode of irregular heart rhythm lasted throughout the night prior to coming in.  She underwent a DCCV in the ER which was unsuccessful, and therefore she was admitted for further recommendations.  The patient was placed on flecainide after admission and converted to normal sinus rhythm approximately 2 hours later.  She was to continue to use propanolol and was given a "pill in the pocket" prescription for flecainide 300 mg to take if she did not come out of atrial fibrillation after taking propanolol.  Because she was converted to normal sinus rhythm within 24 hours she was not placed on anticoagulation therapy.  She reports on episode of rapid HR last week when she was dog sitting for her brother. It lasted < 1 hour.  She did not have the flecainide with her to use for this. She has not had any further episodes. She remains active, swimming everyday. Does some walking but this is limited by the heat and humidity, as well as an injured left ankle which continues to give her some trouble, although the injury occurred 2 years ago.   Past Medical History:  Diagnosis Date  . Anticoagulation adequate 10/04/2018  . Atrial fibrillation (HCC) 10/03/2018  . HLD (hyperlipidemia) 10/04/2018  . Migraine   . Morbid obesity (HCC) 10/04/2018    Past Surgical History:  Procedure Laterality Date  . left ankle reconstruction Left 01/03/2017   Dr. Lajoyce Cornersuda     Current Outpatient Medications  Medication Sig Dispense Refill  . ALPRAZolam (XANAX) 0.25 MG tablet Take 0.25 mg by mouth 2 (two)  times daily as needed for anxiety.    . flecainide (TAMBOCOR) 150 MG tablet Take 2 tablets (300 mg total) by mouth once as needed (atrial fib). 2 tablet 3  . propranolol (INDERAL) 80 MG tablet Take 80 mg by mouth at bedtime.    . sertraline (ZOLOFT) 100 MG tablet Take 50 mg by mouth daily.     No current facility-administered medications for this visit.     Allergies:   Patient has no known allergies.    Social History:  The patient  reports that she has been smoking. She has never used smokeless tobacco. She reports current alcohol use. She reports current drug use. Drug: Marijuana.   Family History:  The patient's family history includes Atrial fibrillation in her father; Healthy in her mother; Migraines in her sister.    ROS: All other systems are reviewed and negative. Unless otherwise mentioned in H&P    PHYSICAL EXAM: VS:  BP 119/83   Pulse (!) 57   Ht 6\' 2"  (1.88 m)   Wt (!) 383 lb (173.7 kg)   SpO2 98%   BMI 49.17 kg/m  , BMI Body mass index is 49.17 kg/m. GEN: Well nourished, well developed, in no acute distress HEENT: normal Neck: no JVD, carotid bruits, or masses Cardiac: RRR; no murmurs, rubs, or gallops,no edema  Respiratory:  Clear to auscultation bilaterally, normal work of breathing GI: soft, nontender, nondistended, + BS MS: no deformity or atrophy Skin: warm and dry, no rash Neuro:  Strength and sensation are intact Psych: euthymic mood, full affect   EKG: Sinus bradycardia with sinus arrhythmia. Heart rate of 57 bpm.  Recent Labs: 10/03/2018: ALT 27; Magnesium 1.9; TSH 1.435 10/04/2018: BUN 10; Creatinine, Ser 0.79; Hemoglobin 12.9; Platelets 334; Potassium 3.7; Sodium 137    Lipid Panel    Component Value Date/Time   CHOL 196 10/04/2018 0315   TRIG 277 (H) 10/04/2018 0315   HDL 40 (L) 10/04/2018 0315   CHOLHDL 4.9 10/04/2018 0315   VLDL 55 (H) 10/04/2018 0315   LDLCALC 101 (H) 10/04/2018 0315      Wt Readings from Last 3 Encounters:   10/31/18 (!) 383 lb (173.7 kg)  10/03/18 (!) 384 lb 11.2 oz (174.5 kg)  05/21/18 298 lb (135.2 kg)      Other studies Reviewed: 10/03/2018  1. The left ventricle has normal systolic function with an ejection fraction of 60-65%. The cavity size was normal. Left ventricular diastolic function could not be evaluated secondary to atrial fibrillation. No evidence of left ventricular regional  wall motion abnormalities.  2. The right ventricle has normal systolic function. The cavity was normal. There is no increase in right ventricular wall thickness.   ASSESSMENT AND PLAN:  1. PAF: She remains on propanolol an prn flecainide. She had one episode of racing heart rate on week ago, lasting <1 hour. I have instructed her to call us if this occurs again with regularity, once a week or more. We may need to increased propanolol dose. She is given refills on her medications.    2. Hyperlipidemia: Not currently on statin therapy.  PCP is following for labs.   3. Morbid Obesity: Consider OSA study if she continues to have recurrent Afib.    Current medicines are reviewed at length with the patient today.    Labs/ tests ordered today include: None  Phill Myron. West Pugh, ANP, AACC   10/31/2018 9:12 AM    [address] [address] Office [phone] Fax [phone]

## 2018-10-31 ENCOUNTER — Ambulatory Visit (INDEPENDENT_AMBULATORY_CARE_PROVIDER_SITE_OTHER): Payer: BC Managed Care – PPO | Admitting: Adult Health

## 2018-10-31 ENCOUNTER — Other Ambulatory Visit: Payer: Self-pay

## 2018-10-31 ENCOUNTER — Encounter: Payer: Self-pay | Admitting: Adult Health

## 2018-10-31 VITALS — BP 119/83 | HR 57 | Ht 74.0 in | Wt 383.0 lb

## 2018-10-31 DIAGNOSIS — I480 Paroxysmal atrial fibrillation: Secondary | ICD-10-CM

## 2018-10-31 DIAGNOSIS — E7800 Pure hypercholesterolemia, unspecified: Secondary | ICD-10-CM | POA: Diagnosis not present

## 2018-10-31 NOTE — Patient Instructions (Signed)

## 2018-11-12 ENCOUNTER — Other Ambulatory Visit: Payer: Self-pay

## 2018-11-12 ENCOUNTER — Encounter: Payer: Self-pay | Admitting: Internal Medicine

## 2018-11-12 ENCOUNTER — Ambulatory Visit: Payer: BC Managed Care – PPO | Attending: Internal Medicine | Admitting: Internal Medicine

## 2018-11-12 VITALS — BP 114/79 | HR 72 | Temp 99.5°F | Resp 16 | Ht 74.0 in | Wt 385.0 lb

## 2018-11-12 DIAGNOSIS — Z6841 Body Mass Index (BMI) 40.0 and over, adult: Secondary | ICD-10-CM

## 2018-11-12 DIAGNOSIS — Z789 Other specified health status: Secondary | ICD-10-CM

## 2018-11-12 DIAGNOSIS — F419 Anxiety disorder, unspecified: Secondary | ICD-10-CM | POA: Diagnosis not present

## 2018-11-12 DIAGNOSIS — I480 Paroxysmal atrial fibrillation: Secondary | ICD-10-CM

## 2018-11-12 DIAGNOSIS — F329 Major depressive disorder, single episode, unspecified: Secondary | ICD-10-CM

## 2018-11-12 DIAGNOSIS — E782 Mixed hyperlipidemia: Secondary | ICD-10-CM

## 2018-11-12 DIAGNOSIS — F32A Depression, unspecified: Secondary | ICD-10-CM

## 2018-11-12 DIAGNOSIS — R0683 Snoring: Secondary | ICD-10-CM | POA: Diagnosis not present

## 2018-11-12 NOTE — Progress Notes (Signed)
Patient ID: Lori Boyd, female    DOB: [birth year]  MRN: [id number]  CC: New Patient (Initial Visit)   Subjective: Lori Boyd is a 28 y.o. female who presents for new patient visit and hospital follow-up.  Her concerns today include:  Patient with history of HL, obesity, migraines without aura, PAF, depression/anxiety  Previous PCP a [facility].  Last seen about 1 mth ago.  Patient states she was told to follow-up here after hospitalization for weight management  Patient hospitalized 7/8-11/2018 with new onset atrial fibrillation.  Palpitations started 2 hrs after she took propranolol for the first time for migraine prophylaxis.  Found to be in A. fib in the ER and underwent DCCV unsuccessfully.  Placed on diltiazem drip.  Given 1 dose of flecainide with subsequent conversion to sinus rhythm.  Patient was not started on anticoagulation as she converted within 24 hours.  Echo revealed normal systolic function, normal cavity sizes for both the right and left ventricles and EF of 60-65%. Patient has seen cardiology PA in follow-up since discharge.  Patient was told to continue propranolol and use flecainide PRN.  No further episodes of palpitations.  There was some mention of her being evaluated for possible OSA -Patient endorses loud snoring, morning headaches and waking up feeling unrefreshed about 50% of the times.  She also endorses daytime tiredness but feels that some of her medications may be contributing to that.  She tells me that she had surgery on her nose several years ago and the snoring has decreased to some.  Anx/dep:  Doing well on Zoloft; on it x 1 yr.  Down to 50 mg from 100 mg.  Trying to wean self off completely.  Use to see a therapist but not any more.  Xanax on med list but has not taken it in 6 mths.  "I just kinda have it as an emergency thing but it does not work."  Obesity: Patient reports being overweight for some time.  However she gained about 120  pounds in 8 months to 1 year after she sustained injury to the left ankle when she was kicked by a horse 07/2016.  Had surgery on the ankle to repair a tendon 01/2017.  Since then she is still had intermittent pain especially if she walks on it for too long.  Prior to the injury she weighed between 200 to 250 pounds. -In terms of activity, she states "am as active as I can be" given the ankle injury.  She takes short walks does yoga and swims intermittently. -She admits that she eats a lot.  "I love healthy foods I just overeat."  She has never seen a nutritionist in the past.  "I know how to eat healthy."  She drinks alcoholic beverages a few times a month.  When she drinks she tends to overdo it by her own admission.  She would drink 3-6 alcoholic beverages in 1 setting.  Past medical, social, family and surgical histories were reviewed and updated. Patient Active Problem List   Diagnosis Date Noted  . Loud snoring 11/12/2018  . Anticoagulation adequate 10/04/2018  . Morbid obesity ([place]) 10/04/2018  . HLD (hyperlipidemia) 10/04/2018  . Atrial fibrillation ([place]) 10/03/2018  . 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 Sprain of anterior talofibular ligament of left ankle 11/24/2016     Current Outpatient Medications on File Prior to Visit  Medication Sig Dispense Refill  . ALPRAZolam (XANAX) 0.25 MG tablet Take 0.25 mg by  mouth 2 (two) times daily as needed for anxiety.    . flecainide (TAMBOCOR) 150 MG tablet Take 2 tablets (300 mg total) by mouth once as needed (atrial fib). 2 tablet 3  . propranolol (INDERAL) 80 MG tablet Take 80 mg by mouth at bedtime.    . sertraline (ZOLOFT) 100 MG tablet Take 50 mg by mouth daily.     No current facility-administered medications on file prior to visit.     No Known Allergies  Social History   Socioeconomic History  . Marital status: Married    Spouse name: Not on file  . Number of children: 0  . Years of education: Associates   . Highest education level: Not on file  Occupational History  . Not on file  Social Needs  . Financial resource strain: Not on file  . Food insecurity    Worry: Not on file    Inability: Not on file  . Transportation needs    Medical: Not on file    Non-medical: Not on file  Tobacco Use  . Smoking status: Former Games developermoker  . Smokeless tobacco: Never Used  Substance and Sexual Activity  . Alcohol use: Yes    Comment: a few times but in excess  . Drug use: Yes    Types: Marijuana    Comment: a few times a mth for recreaion  . Sexual activity: Yes    Comment: lesbian  Lifestyle  . Physical activity    Days per week: Not on file    Minutes per session: Not on file  . Stress: Not on file  Relationships  . Social Musicianconnections    Talks on phone: Not on file    Gets together: Not on file    Attends religious service: Not on file    Active member of club or organization: Not on file    Attends meetings of clubs or organizations: Not on file    Relationship status: Not on file  . Intimate partner violence    Fear of current or ex partner: Not on file    Emotionally abused: Not on file    Physically abused: Not on file    Forced sexual activity: Not on file  Other Topics Concern  . Not on file  Social History Narrative  . Not on file    Family History  Problem Relation Age of Onset  . Healthy Mother   . Atrial fibrillation Father   . Migraines Father   . Migraines Sister     Past Surgical History:  Procedure Laterality Date  . left ankle reconstruction Left 01/03/2017   Dr. Lajoyce Cornersuda    ROS: Review of Systems Negative except as stated above  PHYSICAL EXAM: BP 114/79   Pulse 72   Temp 99.5 F (37.5 C) (Oral)   Resp 16   Ht 6\' 2"  (1.88 m)   Wt (!) 385 lb (174.6 kg)   LMP 11/10/2018   SpO2 97%   BMI 49.43 kg/m   Physical Exam  General appearance - alert, well appearing, morbidly obese young Caucasian female and in no distress Mental status - normal mood,  behavior, speech, dress, motor activity, and thought processes Eyes - pupils equal and reactive, extraocular eye movements intact Nose - normal and patent, no erythema, discharge or polyps Mouth - mucous membranes moist, pharynx normal without lesions Neck - supple, no significant adenopathy Chest - clear to auscultation, no wheezes, rales or rhonchi, symmetric air entry Heart - normal rate, regular  rhythm, normal S1, S2, no murmurs, rubs, clicks or gallops Extremities -no lower extremity edema  [facility] 11/12/2018  Decreased Interest 1  Down, Depressed, Hopeless 1  PHQ - 2 Score 2  Altered sleeping 2  Tired, decreased energy 2  Change in appetite 3  Feeling bad or failure about yourself  2  Trouble concentrating 1  Moving slowly or fidgety/restless 0  Suicidal thoughts 0  PHQ-9 Score 12   GAD 7 : Generalized Anxiety Score 11/12/2018  Nervous, Anxious, on Edge 1  Control/stop worrying 2  Worry too much - different things 2  Trouble relaxing 1  Restless 0  Easily annoyed or irritable 0  Afraid - awful might happen 1  Total GAD 7 Score 7     CMP Latest Ref Rng & Units 10/04/2018 10/03/2018  Glucose 70 - 99 mg/dL 93 161(W101(H)  BUN 6 - 20 mg/dL 10 9  Creatinine 9.600.44 - 1.00 mg/dL 4.540.79 0.980.91  Sodium 119135 - 145 mmol/L 137 140  Potassium 3.5 - 5.1 mmol/L 3.7 4.4  Chloride 98 - 111 mmol/L 109 109  CO2 22 - 32 mmol/L 20(L) 22  Calcium 8.9 - 10.3 mg/dL 1.4(N8.7(L) 9.2  Total Protein 6.5 - 8.1 g/dL - 6.4(L)  Total Bilirubin 0.3 - 1.2 mg/dL - 0.5  Alkaline Phos 38 - 126 U/L - 76  AST 15 - 41 U/L - 23  ALT 0 - 44 U/L - 27   Lipid Panel     Component Value Date/Time   CHOL 196 10/04/2018 0315   TRIG 277 (H) 10/04/2018 0315   HDL 40 (L) 10/04/2018 0315   CHOLHDL 4.9 10/04/2018 0315   VLDL 55 (H) 10/04/2018 0315   LDLCALC 101 (H) 10/04/2018 0315    CBC    Component Value Date/Time   WBC 10.8 (H) 10/04/2018 0315   RBC 4.29 10/04/2018 0315   HGB 12.9 10/04/2018 0315    HCT 38.0 10/04/2018 0315   PLT 334 10/04/2018 0315   MCV 88.6 10/04/2018 0315   MCH 30.1 10/04/2018 0315   MCHC 33.9 10/04/2018 0315   RDW 12.1 10/04/2018 0315    ASSESSMENT AND PLAN: 1. PAF (paroxysmal atrial fibrillation) [facility](HCC) Patient followed by cardiology.  Plan is to have her continue propranolol and use the flecainide PRN.  She is not on anticoagulation as she converted to sinus rhythm within 24 hours of presentation  2. Morbid obesity (HCC) Achieving weight loss through healthy eating habits and regular moderate intensity exercise for total of 150 minutes/week minimum.  Encouraged her to do the swimming since she cannot walk as far as she would like. -Recommend referral to medical weight management and patient is agreeable to this. - Amb Ref to Medical Weight Management  3. Loud snoring History very suggestive of sleep apnea.  Will refer for home sleep study - Home sleep test  4. Social alcohol use I went over how much is too much for a female to drink on a daily basis which is more than 1 standard drink a day and no more than 2 in one setting like when out with friends  5. Anxiety and depression Patient feels that she is doing well on Zoloft even though PHQ 9 is 12 today.  I told her that usually after symptoms, under good control, we will wait at least 6 months if not more to attempt to wean off the antidepressant/antianxiety medication if the person desires to come off of it.  If she feels she  has meet that criteria, then I tell her that she can try decreasing the 50 mg to half a tablet a day for several weeks and see how she does before stopping it.  6. Mixed hyperlipidemia Went over lipid profile.  Encourage healthy eating habits and regular exercise  I spent about 30 minutes with this patient in direct face-to-face evaluation discussing diagnosis and management and coordinating care Patient was given the opportunity to ask questions.  Patient verbalized understanding of  the plan and was able to repeat key elements of the plan.   Orders Placed This Encounter  Procedures  . Amb Ref to Medical Weight Management  . Home sleep test     Requested Prescriptions    No prescriptions requested or ordered in this encounter    Return in about 4 months (around 03/14/2019).  Jonah Blueeborah Johnson, MD, FACP

## 2018-11-12 NOTE — Patient Instructions (Signed)
Healthy Eating Following a healthy eating pattern may help you to achieve and maintain a healthy body weight, reduce the risk of chronic disease, and live a long and productive life. It is important to follow a healthy eating pattern at an appropriate calorie level for your body. Your nutritional needs should be met primarily through food by choosing a variety of nutrient-rich foods. What are tips for following this plan? Reading food labels  Read labels and choose the following: ? Reduced or low sodium. ? Juices with 100% fruit juice. ? Foods with low saturated fats and high polyunsaturated and monounsaturated fats. ? Foods with whole grains, such as whole wheat, cracked wheat, brown rice, and wild rice. ? Whole grains that are fortified with folic acid. This is recommended for women who are pregnant or who want to become pregnant.  Read labels and avoid the following: ? Foods with a lot of added sugars. These include foods that contain brown sugar, corn sweetener, corn syrup, dextrose, fructose, glucose, high-fructose corn syrup, honey, invert sugar, lactose, malt syrup, maltose, molasses, raw sugar, sucrose, trehalose, or turbinado sugar.  Do not eat more than the following amounts of added sugar per day:  6 teaspoons (25 g) for women.  9 teaspoons (38 g) for men. ? Foods that contain processed or refined starches and grains. ? Refined grain products, such as white flour, degermed cornmeal, white bread, and white rice. Shopping  Choose nutrient-rich snacks, such as vegetables, whole fruits, and nuts. Avoid high-calorie and high-sugar snacks, such as potato chips, fruit snacks, and candy.  Use oil-based dressings and spreads on foods instead of solid fats such as butter, stick margarine, or cream cheese.  Limit pre-made sauces, mixes, and "instant" products such as flavored rice, instant noodles, and ready-made pasta.  Try more plant-protein sources, such as tofu, tempeh, black beans,  edamame, lentils, nuts, and seeds.  Explore eating plans such as the Mediterranean diet or vegetarian diet. Cooking  Use oil to saut or stir-fry foods instead of solid fats such as butter, stick margarine, or lard.  Try baking, boiling, grilling, or broiling instead of frying.  Remove the fatty part of meats before cooking.  Steam vegetables in water or broth. Meal planning   At meals, imagine dividing your plate into fourths: ? One-half of your plate is fruits and vegetables. ? One-fourth of your plate is whole grains. ? One-fourth of your plate is protein, especially lean meats, poultry, eggs, tofu, beans, or nuts.  Include low-fat dairy as part of your daily diet. Lifestyle  Choose healthy options in all settings, including home, work, school, restaurants, or stores.  Prepare your food safely: ? Wash your hands after handling raw meats. ? Keep food preparation surfaces clean by regularly washing with hot, soapy water. ? Keep raw meats separate from ready-to-eat foods, such as fruits and vegetables. ? Cook seafood, meat, poultry, and eggs to the recommended internal temperature. ? Store foods at safe temperatures. In general:  Keep cold foods at 59F (4.4C) or below.  Keep hot foods at 159F (60C) or above.  Keep your freezer at [facility] (-17.8C) or below.  Foods are no longer safe to eat when they have been between the temperatures of 40-159F (4.4-60C) for more than 2 hours. What foods should I eat? Fruits Aim to eat 2 cup-equivalents of fresh, canned (in natural juice), or frozen fruits each day. Examples of 1 cup-equivalent of fruit include 1 small apple, 8 large strawberries, 1 cup canned fruit,  cup  dried fruit, or 1 cup 100% juice. Vegetables Aim to eat 2-3 cup-equivalents of fresh and frozen vegetables each day, including different varieties and colors. Examples of 1 cup-equivalent of vegetables include 2 medium carrots, 2 cups raw, leafy greens, 1 cup chopped  vegetable (raw or cooked), or 1 medium baked potato. Grains Aim to eat 6 ounce-equivalents of whole grains each day. Examples of 1 ounce-equivalent of grains include 1 slice of bread, 1 cup ready-to-eat cereal, 3 cups popcorn, or  cup cooked rice, pasta, or cereal. Meats and other proteins Aim to eat 5-6 ounce-equivalents of protein each day. Examples of 1 ounce-equivalent of protein include 1 egg, 1/2 cup nuts or seeds, or 1 tablespoon (16 g) peanut butter. A cut of meat or fish that is the size of a deck of cards is about 3-4 ounce-equivalents.  Of the protein you eat each week, try to have at least 8 ounces come from seafood. This includes salmon, trout, herring, and anchovies. Dairy Aim to eat 3 cup-equivalents of fat-free or low-fat dairy each day. Examples of 1 cup-equivalent of dairy include 1 cup (240 mL) milk, 8 ounces (250 g) yogurt, 1 ounces (44 g) natural cheese, or 1 cup (240 mL) fortified soy milk. Fats and oils  Aim for about 5 teaspoons (21 g) per day. Choose monounsaturated fats, such as canola and olive oils, avocados, peanut butter, and most nuts, or polyunsaturated fats, such as sunflower, corn, and soybean oils, walnuts, pine nuts, sesame seeds, sunflower seeds, and flaxseed. Beverages  Aim for six 8-oz glasses of water per day. Limit coffee to three to five 8-oz cups per day.  Limit caffeinated beverages that have added calories, such as soda and energy drinks.  Limit alcohol intake to no more than 1 drink a day for nonpregnant women and 2 drinks a day for men. One drink equals 12 oz of beer (355 mL), 5 oz of wine (148 mL), or 1 oz of hard liquor (44 mL). Seasoning and other foods  Avoid adding excess amounts of salt to your foods. Try flavoring foods with herbs and spices instead of salt.  Avoid adding sugar to foods.  Try using oil-based dressings, sauces, and spreads instead of solid fats. This information is based on general [place] nutrition guidelines. For more  information, visit choosemyplate.gov. Exact amounts may vary based on your nutrition needs. Summary  A healthy eating plan may help you to maintain a healthy weight, reduce the risk of chronic diseases, and stay active throughout your life.  Plan your meals. Make sure you eat the right portions of a variety of nutrient-rich foods.  Try baking, boiling, grilling, or broiling instead of frying.  Choose healthy options in all settings, including home, work, school, restaurants, or stores. This information is not intended to replace advice given to you by your health care provider. Make sure you discuss any questions you have with your health care provider. Document Released: 06/26/2017 Document Revised: 06/26/2017 Document Reviewed: 06/26/2017 Elsevier Patient Education  2020 [employer]  

## 2019-01-23 ENCOUNTER — Encounter (INDEPENDENT_AMBULATORY_CARE_PROVIDER_SITE_OTHER): Payer: Self-pay | Admitting: Bariatrics

## 2019-01-23 ENCOUNTER — Ambulatory Visit (INDEPENDENT_AMBULATORY_CARE_PROVIDER_SITE_OTHER): Payer: BC Managed Care – PPO | Admitting: Bariatrics

## 2019-01-23 ENCOUNTER — Other Ambulatory Visit: Payer: Self-pay

## 2019-01-23 ENCOUNTER — Encounter: Payer: Self-pay | Admitting: Bariatrics

## 2019-01-23 VITALS — BP 125/84 | HR 88 | Temp 98.7°F | Ht 72.0 in | Wt 388.0 lb

## 2019-01-23 DIAGNOSIS — F3289 Other specified depressive episodes: Secondary | ICD-10-CM

## 2019-01-23 DIAGNOSIS — R0683 Snoring: Secondary | ICD-10-CM

## 2019-01-23 DIAGNOSIS — R0602 Shortness of breath: Secondary | ICD-10-CM | POA: Diagnosis not present

## 2019-01-23 DIAGNOSIS — Z9189 Other specified personal risk factors, not elsewhere classified: Secondary | ICD-10-CM | POA: Diagnosis not present

## 2019-01-23 DIAGNOSIS — E7849 Other hyperlipidemia: Secondary | ICD-10-CM | POA: Diagnosis not present

## 2019-01-23 DIAGNOSIS — Z0289 Encounter for other administrative examinations: Secondary | ICD-10-CM

## 2019-01-23 DIAGNOSIS — Z6841 Body Mass Index (BMI) 40.0 and over, adult: Secondary | ICD-10-CM

## 2019-01-23 DIAGNOSIS — I480 Paroxysmal atrial fibrillation: Secondary | ICD-10-CM

## 2019-01-23 DIAGNOSIS — R5383 Other fatigue: Secondary | ICD-10-CM | POA: Diagnosis not present

## 2019-01-23 DIAGNOSIS — R7309 Other abnormal glucose: Secondary | ICD-10-CM

## 2019-01-23 NOTE — Progress Notes (Signed)
Office: [phone]  /  Fax: [phone]    Date: January 24, 2019  Time Seen: 11:01am Duration: 54 minutes Provider: Glennie Isle, PsyD Type of Session: Intake for Individual Therapy  Type of Contact: Face-to-face  Informed Consent for In-Person Services During COVID-19: During today's appointment, information about the decision to initiate in-person services in light of the [id number]-[id number] public health crisis was discussed. Raynee and this provider agreed to meet in person for some or all future appointments. If there is a resurgence of the pandemic or other health concerns arise, telepsychological services may be initiated and any related concerns will be discussed and an attempt to address them will be made. Arnie verbally acknowledged understanding that if necessary, this provider may determine there is a need to initiate telepsychological services for everyone's well-being. Fajr expressed understanding she may request to initiate telepsychological services, and that request will be respected as long as it is feasible and clinically appropriate. Regarding telepsychological services, Isamar acknowledged she is ultimately responsible for understanding her insurance benefits as it relates to reimbursement of telepsychological services. Moreover, the risks for opting for in-person services was discussed. Remington verbally acknowledged understanding that by coming to the office, she is assuming the risk of exposure to the coronavirus or other public risk, and the risk may increase if Arbadella travels by public transportation, cab, or ridesharing service. To obtain in-person services, Mattye verbally agreed to taking certain precautions (e.g., screening prior to appointment; universal masking; social distancing of 6 feet; proper hand hygiene; no visitors) set forth by [facility] to keep everyone safe from exposure, sickness, and possible death. This information was shared by front desk staff either  at the time of scheduling and/or during the check-in process. Tashara expressed understanding that should she not adhere to these safeguards, it may result in starting/returning to a telepsychological service arrangement and/or the exploration of other options for treatment. Payslie acknowledged understanding that [facility] will follow the protocol set forth by [facility] should a patient present with a fever or other symptoms or disclose recent exposure, which will include rescheduling the appointment. Furthermore, Samamtha acknowledged understanding that precautions may change if additional local, state or federal orders or guidelines are published. This provider also shared that if Chinyere tests positive for the coronavirus, this provider may be required to notify local health authorities that Issa was in the [facility] clinic. Only minimum information necessary for data collection will be disclosed. This provider will follow [facility]'s disclosure policy should this provider or staff test positive for the coronavirus. To avoid handling of paper/writing instruments and increasing likelihood of touching, verbal consent was obtained by Milanna during today's appointment prior to proceeding. Precious provided verbal consent to proceed, and acknowledged understanding that by verbally consenting to proceed, she is agreeable to all information noted above.   Informed Consent: The provider's role was explained to [facility]. The provider reviewed and discussed issues of confidentiality, privacy, and limits therein (e.g., reporting obligations). In addition to verbal informed consent, written informed consent for psychological services was obtained from [place] prior to the initial intake interview. Written consent included information concerning the practice, financial arrangements, and confidentiality and patients' rights. Since the clinic is not a 24/7 crisis center, mental  health emergency resources were shared, and the provider explained MyChart, e-mail, voicemail, and/or other messaging systems should be utilized only for non-emergency reasons. This provider also explained that information obtained during appointments will be placed in Alechia's medical  record in a confidential manner and relevant information will be shared with other providers at [facility] that she meets with for coordination of care. Renesha verbally acknowledged understanding of the aforementioned, and agreed to use mental health emergency resources discussed if needed. Moreover, Marylene agreed information may be shared with other [facility] providers as needed for coordination of care. By signing the service agreement document, Zarria provided written consent for coordination of care.   Chief Complaint/HPI: Jaslene was referred by Dr. Jearld Lesch due to depression with emotional eating behaviors. Per the note for the initial visit with Dr. Jearld Lesch on January 23, 2019, "Maziyah is struggling with emotional eating and using food for comfort to the extent that it is negatively impacting her health. She often snacks when she is not hungry. Alesha sometimes feels she is out of control and then feels guilty that she made poor food choices. She has been working on behavior modification techniques to help reduce her emotional eating and has been somewhat successful. Mattingly has a history of a binge eating disorder. She reports taking depression medications daily. She had taken Topamax in the past with no benefit. PHQ-9 score is 22 today. She shows no sign of suicidal or homicidal ideations." During the initial appointment with Dr. Jearld Lesch at [facility] on January 23, 2019, Araiyah further reported experiencing the following: significant food cravings issues , snacking frequently in the evenings, frequently eating larger portions than normal , binge eating  behaviors, struggling with emotional eating, having problems with excessive hunger, frequently skipping breakfast and sometimes making poor food choices.   During today's appointment, Cosima was verbally administered a questionnaire assessing various behaviors related to emotional eating. Adamariz endorsed the following: overeat when you are celebrating, experience food cravings on a regular basis, eat certain foods when you are anxious, stressed, depressed, or your feelings are hurt, use food to help you cope with emotional situations, find food is comforting to you, overeat when you are angry or upset, overeat when you are worried about something, overeat frequently when you are bored or lonely, not worry about what you eat when you are in a good mood, overeat when you are alone, but eat much less when you are with other people and eat as a reward. She shared she craves savory foods followed by a sweet. She noted, "I have to have the balance." Debbe believes the onset of emotional eating was likely in childhood and described the current frequency of emotional eating as "every day." In addition, Jaydi endorsed a history of binge eating. She indicated she does not "snack a whole lot," but at night she will "gorge" herself until she feels sick. She indicated it "hurts" and described feeling out of control. Rasa described a typical binge as "a lot of dinner and maybe something after that." Cilicia reported the aforementioned occurs "a majority of the week." The onset of binge eating was around the age of 54 after she moved into her own place. Langley denied a history of restricting food intake, purging and engagement in other compensatory strategies. She noted her PCP diagnosed her with Binge Eating Disorder and she was prescribed Topamax. She noted Topamax did not work and she also tried therapeutic services for emotional regulation. Moreover, Mattisyn indicated depression triggers emotional eating, whereas  spending time with her dog (Milly) makes emotional eating better. Furthermore, Ashiyah denied other problems of concern.    Mental Status Examination:  Appearance: neat Behavior:  cooperative Mood: euthymic Affect: mood congruent Speech: normal in rate, volume, and tone Eye Contact: appropriate Psychomotor Activity: appropriate Thought Process: linear, logical, and goal directed  Content/Perceptual Disturbances: denies suicidal and homicidal ideation, plan, and intent and no hallucinations, delusions, bizarre thinking or behavior reported or observed Orientation: time, person, place and purpose of appointment Cognition/Sensorium: memory, attention, language, and fund of knowledge intact  Insight: good Judgment: good  Family & Psychosocial History: Darlyn reported she is married and she does not have any children. 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 She noted she has an individual that resides with her and she is his primary care giver. Additionally, Atira shared her highest level of education obtained is an associate's degree. Currently, Alanda's social support system consists of her wife, mom, siblings, and a few good friends. Moreover, Deshanna stated she resides with her wife and the individual she cares for.   Medical History:  Past Medical History:  Diagnosis Date   Anticoagulation adequate 10/04/2018   Anxiety    Atrial fibrillation ([place]) 10/03/2018   Depression    HLD (hyperlipidemia) 10/04/2018   Left ankle pain    Migraine    Morbid obesity ([place]) 10/04/2018   Multifocal choroiditis    Recurrent sinus infections    Past Surgical History:  Procedure Laterality Date   left ankle reconstruction Left 01/03/2017   Dr. Sharol Given   NASAL SINUS SURGERY     WISDOM TOOTH EXTRACTION     Current Outpatient Medications on File Prior to Visit  Medication Sig Dispense Refill   ALPRAZolam (XANAX) 0.25 MG tablet Take 0.25 mg by mouth 2 (two) times daily as  needed for anxiety.     buPROPion (WELLBUTRIN XL) 150 MG 24 hr tablet Take 150 mg by mouth daily.     flecainide (TAMBOCOR) 150 MG tablet Take 2 tablets (300 mg total) by mouth once as needed (atrial fib). 2 tablet 3   propranolol (INDERAL) 80 MG tablet Take 80 mg by mouth at bedtime.     sertraline (ZOLOFT) 100 MG tablet Take 50 mg by mouth daily.     No current facility-administered medications on file prior to visit.   Janeka reported a history of concussion when riding horses, but denied receiving medical attention. She denied LOC. Jasmain believes her last fall was approximately three years ago.   Mental Health History: Callee endorsed a history of therapeutic services. She attended therapeutic services in April of 2020 after she was referred by her PCP. Michaline stated treatment focused on depression, stress, and relationships. She attended appointments twice a week for approximately five months. Arena reported she last met with her therapist Lilyan Punt with [facility]) in August of 2020. Niah denied a history of hospitalizations for psychiatric concerns, and has never met with a psychiatrist. Halia stated her PCP prescribes Xanax PRN, Wellbutrin, and Zoloft. She is unsure if Xanax is helping. She noted an increase in depression since her Zoloft dose was reduced. Thus, it was recommended she speak with her PCP; Meral agreed. Bricia endorsed a family history of mental health related concerns. She noted her maternal aunt suffers from "really bad depression" and her mother has "really crazy anxiety." Kaityln denied a trauma history, including psychological, physical  and sexual abuse, as well as neglect.   Teodora described her typical mood as "more positive in the mornings, but as the day progresses, that's when it starts to happen [depressed mood]." Aside from concerns noted above and endorsed  on the PHQ-9 and GAD-7, Marchella reported experiencing decreased motivation;  hopelessness about weight loss; social withdrawal; crying spells; and worry about the well-being of her family. She further reported experiencing worry as she has a "bad relationship" with her father. Additionally, Annalisia reported experiencing panic attacks 1-2xs a month and noted the last one was a month ago. Panic attacks are characterized by difficulty breathing and feeling overwhelmed. She further shared she experiences depressed mood every other day resulting in her staying in bed. Karoline noted her wife helps her cope. Kehlani endorsed current alcohol use. More specifically, she consumes alcohol "every other weekend" in the form of five standard drinks over the course of 4-5+ hours. She denied tobacco use. She endorsed illicit/recreational substance use. She disclosed smoking marijuana. Leshawn noted she smokes "half a joint [1-2 grams]" every other day at night as it makes her "really happy" and improves sleep. Kenyia noted she always smokes at home and does not operate machinery/vehicles under the influence. She denied any consequences of her alcohol or marijuana use. Regarding caffeine intake, Laqueta reported consuming 1-2 cups of coffee every other day. Furthermore, Myrene denied experiencing the following: hallucinations and delusions, paranoia, symptoms of mania (e.g., expansive mood, flighty ideas, decreased need for sleep, engagement in risky behaviors) and angry outbursts. She also denied current suicidal ideation, plan, and intent; history of and current homicidal ideation, plan, and intent; and history of and current engagement in self-harm.  Aritza reported she first experienced suicidal ideation in middle school and has experienced it on and off since. She denied ever experiencing suicidal plan and intent. Aarini further denied a history of suicidal gestures and attempts. She recalled in April of 2020 feeling helpless and she noted, "I just wanted it to end. I didn't want to die. It was  just a horrible feeling." She clarified whenever she experiences suicidal ideation it is in the form of the aforementioned. She last experienced suicidal ideation three days ago and reported the frequency of suicidal ideation as "random." Notably, Arnitra endorsed item 9 (i.e., "Do you feel that your weight problem is so hopeless that sometimes life doesn't seem worth living?") on the modified PHQ-9 during her initial appointment with Dr. Jearld Lesch on January 23, 2019. She clarified she endorsed that item due to feeling hopeless about weight and not due to suicidal ideation.   A patient safety plan was completed. Due to COVID-19, this provider wrote down information shared by Shahana on the safety plan. The plan included the following information: warning signs that a crisis may be developing; internal coping strategies (e.g., physical activity or a relaxation technique); people and social settings that provide distraction; people to ask for help; professional and/or agencies to contact during a crisis; ways to make the environment safe; and the most important thing worth living for. Phone numbers were noted, including the number for the Suicide Prevention Lifeline. The following information was noted on Hazelle 's safety plan:  Step 1: Warning signs (thoughts, images, mood, situation, behavior) that a crisis may be developing: 1. Depressed mood 2. Feeling worthless 3. Feeling not good enough  Step 2: Internal coping strategies- Things I can do to take my mind off my problems without contacting another person (relaxation technique, physical activity): 1. Grounding techniques (e.g., visualizations) 2. Spending time with Milly 3. Eating 4. Listening to music (upbeat play list)  Step 3: People and social settings that provide distraction: 1. Name: Inocente Salles (brother)       Phone: [phone] 2.  Name: Aldona Bar (sister in law)       Phone: [phone] 3.   Place: park with [place] (neighborhood,  [place]) 4.   Place: living room due to natural light  Step 4: People whom I can ask for help: 1. Name: Ander Purpura (wife)       Phone: [phone] 2. Name: Inocente Salles (brother)       Phone: [phone] 3.   Name: Aldona Bar (sister in law)       Phone: [phone]  Step 5: Professionals or agencies I can contact during a crisis [Porscha was provided with a handout with emergency resources; therefore, she asked this provider to note on the safety plan to refer to the handout.]  Step 6: Making the environment safe: 1. Don't have access to weapons/firearms  Protective factors were identified under the section of the safety plan indicating  "The one thing that is most important to me and worth living for is." The following protective factors were noted: family, Milly, Ander Purpura, future (house in the [place] with horses)  George was provided with the original copy of the safety plan in an envelope, and a copy was retained to be included in her records. This provider explained that once she left this office with the safety plan, this provider could not ensure confidentiality; therefore, this provider encouraged Chrisann to place her safety plan in a safe, yet accessible place. She acknowledged understanding, and agreed. Psychoeducation regarding the importance of reaching out to a trusted individual and/or utilizing emergency resources if there is a change in emotional status and/or there is an inability to ensure safety was provided. Preslea's confidence in reaching out to a trusted individual and/or utilizing emergency resources should there be an intensification in emotional status and/or there is an inability to ensure safety was assessed on a scale of one to ten where one is not confident and ten is extremely confident. She reported her confidence is a 10. Additionally, Jaanai denied current access to firearms and/or weapons. Nneka was receptive to the safety plan as evidenced by her stating, "Sounds  like a good plan."  The following strengths were reported by Deniz: caring, good wife, really good sister, really good aunt, and awesome fur baby mom. The following strengths were observed by this provider: ability to express thoughts and feelings during the therapeutic session, ability to establish and benefit from a therapeutic relationship, ability to learn and practice coping skills, willingness to work toward established goal(s) with the clinic and ability to engage in reciprocal conversation.  Legal History: Murlean denied a history of legal involvement.   Structured Assessment Results: The Patient Health Questionnaire-9 (PHQ-9) is a self-report measure that assesses symptoms and severity of depression over the course of the last two weeks. Angelik obtained a score of 14 suggesting moderate depression. Deysi finds the endorsed symptoms to be somewhat difficult. Little interest or pleasure in doing things 2  Feeling down, depressed, or hopeless 2  Trouble falling or staying asleep, or sleeping too much 0  Feeling tired or having little energy 2  Poor appetite or overeating 3  Feeling bad about yourself --- or that you are a failure or have let yourself or your family down 3  Trouble concentrating on things, such as reading the newspaper or watching television 2  Moving or speaking so slowly that other people could have noticed? Or the opposite --- being so fidgety or restless that you have been moving around a lot more than usual 0  Thoughts that you would  be better off dead or hurting yourself in some way 0  PHQ-9 Score 14    The Generalized Anxiety Disorder-7 (GAD-7) is a brief self-report measure that assesses symptoms of anxiety over the course of the last two weeks. Solita obtained a score of 19 suggesting severe anxiety. Becca finds the endorsed symptoms to be somewhat difficult. Feeling nervous, anxious, on edge 2  Not being able to stop or control worrying 3  Worrying too  much about different things 3  Trouble relaxing 3  Being so restless that it's hard to sit still 3  Becoming easily annoyed or irritable 2  Feeling afraid as if something awful might happen 3  GAD-7 Score 19   Interventions: A chart review was conducted prior to the clinical intake interview. The PHQ-9, and GAD-7 were verbally administered and a clinical intake interview was completed. In addition, Dellene was verbally administered a Mood and Food questionnaire to assess various behaviors related to emotional eating. Throughout session, empathic reflections and validation was provided. A risk assessment was completed and a safety plan was developed. This provider recommended longer-term therapeutic services due to Cricket's history and current symptomatology per her self-report and scores on administered measures and discussed options to establish care with a new provider, including Phylisha contacting her insurance company for a list of in-network providers, exploring [email or web address], or this provider placing a referral. Bellamia provided verbal consent for this provider to place a referral to address depression, relationship concerns, and emotional/binge eating. She also requested this provider indicate the following: "Wants to work with a provider that has experience working with the LGBT community." In addition, Delia requested a referral for psychiatric services for medication management. Thus, with her verbal consent, a referral was placed. This provider recommended Wilda continue to meet with this provider until she is established with a new provider for therapeutic services. She agreed; therefore, continuing treatment with this provider was discussed and a treatment goal was established. Furthermore, psychoeducation regarding emotional versus physical hunger was provided. Lyrick was given a handout to utilize between now and the next appointment to increase awareness of hunger patterns and  subsequent eating.   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 The next appointment will be scheduled in less than one week via WebEx. The following treatment goal was established: decrease emotional eating. The treatment modality will be individual therapeutic services, including an eclectic therapeutic approach utilizing techniques from Cognitive Behavioral Therapy, Patient Centered Therapy, Dialectical Behavior Therapy, Acceptance and Commitment Therapy, Interpersonal Therapy, and Cognitive Restructuring. Therapeutic approach will include various interventions as appropriate, such as validation, support, mindfulness, thought defusion, reframing, psychoeducation, values assessment, and role playing. This provider will regularly review the treatment plan and medical chart to keep informed of status changes. Genowefa expressed understanding and agreement with the initial treatment plan of care. This provider will also continue to assess for safety.

## 2019-01-23 NOTE — Progress Notes (Signed)
Office: [phone]  /  Fax: [phone]   Dear Dr. Karle Plumber,   Thank you for referring Lori Boyd to our clinic. The following note includes my evaluation and treatment recommendations.  HPI:   Chief Complaint: OBESITY    Lori Boyd has been referred by Dr. Karle Plumber for consultation regarding her obesity and obesity related comorbidities.    Lori Boyd (MR# [id number]) is a 28 y.o. female who presents on 01/23/2019 for obesity evaluation and treatment. Current BMI is Body mass index is 52.62 kg/m.Marland Kitchen Lori Boyd has been struggling with her weight for many years and has been unsuccessful in either losing weight, maintaining weight loss, or reaching her healthy weight goal.     Lori Boyd attended our information session and states she is currently in the action stage of change and ready to dedicate time achieving and maintaining a healthier weight. Lori Boyd is interested in becoming our patient and working on intensive lifestyle modifications including (but not limited to) diet, exercise and weight loss.    Lori Boyd has episodes of drinking soft drinks on weekends. She sometimes misses breakfast.     Lori Boyd states her family eats meals together she thinks her family will eat healthier with her she struggles with family and or coworkers weight loss sabotage her desired weight loss is 138 lbs she has been heavy most of her life she started gaining weight in middle school her heaviest weight ever was 388 lbs. she has significant food cravings issues  she snacks frequently in the evenings she skips breakfast frequently she sometimes makes poor food choices she has problems with excessive hunger  she frequently eats larger portions than normal  she has binge eating behaviors she struggles with emotional eating    Lori Boyd feels her energy is lower than it should be. This has worsened with weight gain and has worsened recently. Lori Boyd denies daytime  somnolence and  admits to waking up still tired. Patient generally gets 7+ hours of sleep per night, and states they generally do not have restful sleep. Snoring is present. Apneic episodes are not present. Epworth Sleepiness Score is 6.  Dyspnea on exertion Lori Boyd notes increasing shortness of breath with exercising and seems to be worsening over time with weight gain. She notes getting out of breath sooner with activity than she used to. This has gotten worse recently. Lori Boyd denies orthopnea.  Hyperlipidemia Lori Boyd has hyperlipidemia and is on no medication. She has been trying to improve her cholesterol levels with intensive lifestyle modification including a low saturated fat diet, exercise and weight loss. HDL 40 on 10/04/2018 with triglycerides 277. She denies any chest pain, claudication or myalgias.  Loud Snoring Lori Boyd had an echocardiogram on 10/03/2018 which showed no increased pulmonary pressure on recent echo ( see echo 07/20 ). She states this improved after a sinus study. She has been recommended to have a sleep study.   Depression with emotional eating behaviors Lori Boyd is struggling with emotional eating and using food for comfort to the extent that it is negatively impacting her health. She often snacks when she is not hungry. Lori Boyd sometimes feels she is out of control and then feels guilty that she made poor food choices. She has been working on behavior modification techniques to help reduce her emotional eating and has been somewhat successful. Lori Boyd has a history of a binge eating disorder. She reports taking depression medications daily. She had taken Topamax in the past with no benefit. PHQ-9 score is 22 today. She shows no  sign of suicidal or homicidal ideations.  Depression Screen Lori Boyd's Food and Mood (modified PHQ-9) score was 22. Depression screen PHQ 2/9 01/23/2019  Decreased Interest 3  Down, Depressed, Hopeless 3  PHQ - 2 Score 6  Altered sleeping 2    Tired, decreased energy 3  Change in appetite 3  Feeling bad or failure about yourself  3  Trouble concentrating 2  Moving slowly or fidgety/restless 0  Suicidal thoughts 3  PHQ-9 Score 22  Difficult doing work/chores Extremely dIfficult   Paroxysmal Atrial Fibrillation Lori Boyd has a diagnosis of paroxysmal atrial fibrillation and is taking propanolol. She also has flecainide to take as needed.  Elevated Glucose Lori Boyd has a diagnosis of elevated glucose with an A1c of 5.2 on 10/04/2018. She denies polyphagia.  At risk for diabetes Lori Boyd is at higher than averagerisk for developing diabetes due to her obesity. She currently denies polyuria or polydipsia.  ASSESSMENT AND PLAN:  Other Lori - Plan: EKG 12-Lead, VITAMIN D 25 Hydroxy (Vit-D Deficiency, Fractures), T3, T4, free, TSH  SOB (shortness of breath) on exertion  Other hyperlipidemia  Loud snoring  PAF (paroxysmal atrial fibrillation) (HCC)  Elevated glucose - Plan: Comprehensive metabolic panel, Insulin, random  Other depression - with emotional eating   At risk for diabetes mellitus  Class 3 severe obesity with serious comorbidity and body mass index (BMI) of 50.0 to 59.9 in adult, unspecified obesity type (HCC)  PLAN:  Lori Boyd was informed that her Lori may be related to obesity, depression or many other causes. Labs will be ordered, and in the meanwhile Lori Boyd has agreed to work on diet, exercise and weight loss to help with Lori. Proper sleep hygiene was discussed including the need for 7-8 hours of quality sleep each night. A sleep study was not ordered based on symptoms and Epworth score. Lori Boyd will gradually increase activities.  Dyspnea on exertion Lori Boyd's shortness of breath appears to be obesity related and exercise induced. She has agreed to work on weight loss and gradually increase exercise to treat her exercise induced shortness of breath. If Lori Boyd follows our instructions  and loses weight without improvement of her shortness of breath, we will plan to refer to pulmonology. We will monitor this condition regularly. Lori Boyd agrees to this plan.  Hyperlipidemia Lori Boyd was informed of the American Heart Association Guidelines emphasizing intensive lifestyle modifications as the first line treatment for hyperlipidemia. We discussed many lifestyle modifications today in depth, and Vinie will continue to work on decreasing saturated fats such as fatty red meat, butter and many fried foods. Karuna will decrease carbohydrates and increase healthy fats. She will also increase vegetables and lean protein in her diet and continue to work on exercise and weight loss efforts.  Loud Snoring Lori Boyd was advised she may need a sleep study in the future.  Depression with Emotional Eating Behaviors We discussed behavior modification techniques today to help Shavonda deal with her emotional eating and depression. Tranesha will be referred to Dr. Dewaine Conger, our bariatric psychologist, for evaluation.  Depression Screen Njeri had a strongly positive depression screening. Depression is commonly associated with obesity and often results in emotional eating behaviors. We will monitor this closely and work on CBT to help improve the non-hunger eating patterns.   Paroxysmal Atrial Fibrillation Lori Boyd will follow-up with her PCP and cardiologist as needed.  Elevated Glucose Lori Boyd will have A1c and insulin levels checked. She will return in 2-3 weeks for review of labs.  Diabetes risk counseling Lori Boyd was given  extended (15 minutes) diabetes prevention counseling today. She is 28 y.o. female and has risk factors for diabetes including obesity. We discussed intensive lifestyle modifications today with an emphasis on weight loss as well as increasing exercise and decreasing simple carbohydrates in her diet.  Obesity Lori Boyd is currently in the action stage of change and her goal is to  continue with weight loss efforts. I recommend Lori Boyd begin the structured treatment plan as follows:  She has agreed to follow the Category 4 plan with additional breakfast options. Lori Boyd was given protein substitutions. She will work on meal planning and intentional eating.  Lori Boyd has had ankle surgery and will exercise what is appropriate. We discussed the following Behavioral Modification Strategies today: increasing lean protein intake, decreasing simple carbohydrates, increasing vegetables, increase H20 intake, decrease eating out, no skipping meals, work on meal planning and easy cooking plans, keeping healthy foods in the home, and planning for success.   She was informed of the importance of frequent follow-up visits to maximize her success with intensive lifestyle modifications for her multiple health conditions. She was informed we would discuss her lab results at her next visit unless there is a critical issue that needs to be addressed sooner. Shanieka agreed to keep her next visit at the agreed upon time to discuss these results.  ALLERGIES: No Known Allergies  MEDICATIONS: Current Outpatient Medications on File Prior to Visit  Medication Sig Dispense Refill   ALPRAZolam (XANAX) 0.25 MG tablet Take 0.25 mg by mouth 2 (two) times daily as needed for anxiety.     buPROPion (WELLBUTRIN XL) 150 MG 24 hr tablet Take 150 mg by mouth daily.     flecainide (TAMBOCOR) 150 MG tablet Take 2 tablets (300 mg total) by mouth once as needed (atrial fib). 2 tablet 3   propranolol (INDERAL) 80 MG tablet Take 80 mg by mouth at bedtime.     sertraline (ZOLOFT) 100 MG tablet Take 50 mg by mouth daily.     No current facility-administered medications on file prior to visit.     PAST MEDICAL HISTORY: Past Medical History:  Diagnosis Date   Anticoagulation adequate 10/04/2018   Anxiety    Atrial fibrillation (HCC) 10/03/2018   Depression    HLD (hyperlipidemia) 10/04/2018   Left  ankle pain    Migraine    Morbid obesity (HCC) 10/04/2018   Multifocal choroiditis    Recurrent sinus infections     PAST SURGICAL HISTORY: Past Surgical History:  Procedure Laterality Date   left ankle reconstruction Left 01/03/2017   Dr. Lajoyce Cornersuda   NASAL SINUS SURGERY     WISDOM TOOTH EXTRACTION      SOCIAL HISTORY: Social History   Tobacco Use   Smoking status: Former Smoker   Smokeless tobacco: Never Used  Substance Use Topics   Alcohol use: Yes    Comment: a few times but in excess   Drug use: Yes    Types: Marijuana    Comment: a few times a mth for recreaion    FAMILY HISTORY: Family History  Problem Relation Age of Onset   Healthy Mother    Thyroid disease Mother    Obesity Mother    Atrial fibrillation Father    Migraines Father    Migraines Sister    ROS: Review of Systems  Eyes:       Positive for flashes of light.  Cardiovascular: Negative for chest pain, orthopnea and claudication.       Positive for paroxysmal atrial  fibrillation.  Musculoskeletal: Negative for myalgias.  Neurological: Positive for headaches.  Endo/Heme/Allergies:       Negative for polyphagia.  Psychiatric/Behavioral: Positive for depression (emotional eating). Negative for suicidal ideas.       Negative for homicidal ideas.   PHYSICAL EXAM: Blood pressure 125/84, pulse 88, temperature 98.7 F (37.1 C), height 6' (1.829 m), weight (!) 388 lb (176 kg), last menstrual period 12/27/2018, SpO2 98 %. Body mass index is 52.62 kg/m. Physical Exam Vitals signs reviewed.  Constitutional:      Appearance: Normal appearance. She is well-developed. She is obese.  HENT:     Head: Normocephalic and atraumatic.     Nose: Nose normal.  Eyes:     General: No scleral icterus. Neck:     Musculoskeletal: Normal range of motion.  Cardiovascular:     Rate and Rhythm: Normal rate and regular rhythm.  Pulmonary:     Effort: Pulmonary effort is normal. No respiratory distress.    Abdominal:     Palpations: Abdomen is soft.     Tenderness: There is no abdominal tenderness.  Musculoskeletal: Normal range of motion.     Comments: Range of motion normal in all four extremities.  Skin:    General: Skin is warm and dry.  Neurological:     Mental Status: She is alert and oriented to person, place, and time.     Coordination: Coordination normal.  Psychiatric:        Mood and Affect: Mood and affect normal.        Behavior: Behavior normal.   RECENT LABS AND TESTS: BMET    Component Value Date/Time   NA 137 10/04/2018 0315   K 3.7 10/04/2018 0315   CL 109 10/04/2018 0315   CO2 20 (L) 10/04/2018 0315   GLUCOSE 93 10/04/2018 0315   BUN 10 10/04/2018 0315   CREATININE 0.79 10/04/2018 0315   CALCIUM 8.7 (L) 10/04/2018 0315   GFRNONAA >60 10/04/2018 0315   GFRAA >60 10/04/2018 0315   Lab Results  Component Value Date   HGBA1C 5.2 10/04/2018   No results found for: INSULIN CBC    Component Value Date/Time   WBC 10.8 (H) 10/04/2018 0315   RBC 4.29 10/04/2018 0315   HGB 12.9 10/04/2018 0315   HCT 38.0 10/04/2018 0315   PLT 334 10/04/2018 0315   MCV 88.6 10/04/2018 0315   MCH 30.1 10/04/2018 0315   MCHC 33.9 10/04/2018 0315   RDW 12.1 10/04/2018 0315   Iron/TIBC/Ferritin/ %Sat No results found for: IRON, TIBC, FERRITIN, IRONPCTSAT Lipid Panel     Component Value Date/Time   CHOL 196 10/04/2018 0315   TRIG 277 (H) 10/04/2018 0315   HDL 40 (L) 10/04/2018 0315   CHOLHDL 4.9 10/04/2018 0315   VLDL 55 (H) 10/04/2018 0315   LDLCALC 101 (H) 10/04/2018 0315   Hepatic Function Panel     Component Value Date/Time   PROT 6.4 (L) 10/03/2018 2144   ALBUMIN 3.4 (L) 10/03/2018 2144   AST 23 10/03/2018 2144   ALT 27 10/03/2018 2144   ALKPHOS 76 10/03/2018 2144   BILITOT 0.5 10/03/2018 2144   BILIDIR <0.1 10/03/2018 2144   IBILI NOT CALCULATED 10/03/2018 2144      Component Value Date/Time   TSH 1.435 10/03/2018 1800   No results found for: Vitamin  D, 25-Hydroxy  ECG shows normal sinus rhythm with a rate of 94 BPM. Essentially normal   INDIRECT CALORIMETER done today shows a VO2 of 337 and  a REE of 2348.  Her calculated basal metabolic rate is 9326 thus her basal metabolic rate is worse than expected.  OBESITY BEHAVIORAL INTERVENTION VISIT  Today's visit was #1  Starting weight: 388 lbs Starting date: 01/23/2019 Today's weight: 388 lbs  Today's date: 01/23/2019 Total lbs lost to date: 0    01/23/2019  Height 6' (1.829 m)  Weight 388 lb (176 kg) (A)  BMI (Calculated) 52.61  BLOOD PRESSURE - SYSTOLIC 125  BLOOD PRESSURE - DIASTOLIC 84  Waist Measurement  54 inches   Body Fat % 53.6 %  Total Body Water (lbs) 123.4 lbs  RMR 2348   ASK: We discussed the diagnosis of obesity with Gigi Lahoma Rocker today and Larra agreed to give [place] permission to discuss obesity behavioral modification therapy today.  ASSESS: Makenlee has the diagnosis of obesity and her BMI today is 52.6. Fredricka is in the action stage of change.   ADVISE: Jaycelynn was educated on the multiple health risks of obesity as well as the benefit of weight loss to improve her health. She was advised of the need for long term treatment and the importance of lifestyle modifications to improve her current health and to decrease her risk of future health problems.  AGREE: Multiple dietary modification options and treatment options were discussed and  Joaquina agreed to follow the recommendations documented in the above note.  ARRANGE: Abbagayle was educated on the importance of frequent visits to treat obesity as outlined per CMS and USPSTF guidelines and agreed to schedule her next follow up appointment today.  Fernanda Drum, am acting as Energy manager for [employer], DO  I have reviewed the above documentation for accuracy and completeness, and I agree with the above. -Corinna Capra, DO

## 2019-01-24 ENCOUNTER — Encounter (INDEPENDENT_AMBULATORY_CARE_PROVIDER_SITE_OTHER): Payer: Self-pay | Admitting: Bariatrics

## 2019-01-24 ENCOUNTER — Telehealth (HOSPITAL_COMMUNITY): Payer: Self-pay | Admitting: Psychiatry

## 2019-01-24 ENCOUNTER — Ambulatory Visit (INDEPENDENT_AMBULATORY_CARE_PROVIDER_SITE_OTHER): Payer: BC Managed Care – PPO | Admitting: Psychology

## 2019-01-24 DIAGNOSIS — F509 Eating disorder, unspecified: Secondary | ICD-10-CM

## 2019-01-24 DIAGNOSIS — F331 Major depressive disorder, recurrent, moderate: Secondary | ICD-10-CM

## 2019-01-24 DIAGNOSIS — E559 Vitamin D deficiency, unspecified: Secondary | ICD-10-CM | POA: Insufficient documentation

## 2019-01-24 LAB — COMPREHENSIVE METABOLIC PANEL
ALT: 26 IU/L (ref 0–32)
AST: 22 IU/L (ref 0–40)
Albumin/Globulin Ratio: 1.6 (ref 1.2–2.2)
Albumin: 4.4 g/dL (ref 3.9–5.0)
Alkaline Phosphatase: 111 IU/L (ref 39–117)
BUN/Creatinine Ratio: 15 (ref 9–23)
BUN: 11 mg/dL (ref 6–20)
Bilirubin Total: 0.5 mg/dL (ref 0.0–1.2)
CO2: 21 mmol/L (ref 20–29)
Calcium: 9.4 mg/dL (ref 8.7–10.2)
Chloride: 105 mmol/L (ref 96–106)
Creatinine, Ser: 0.75 mg/dL (ref 0.57–1.00)
GFR calc Af Amer: 128 mL/min/{1.73_m2} (ref >59)
GFR calc non Af Amer: 111 mL/min/{1.73_m2} (ref >59)
Globulin, Total: 2.7 g/dL (ref 1.5–4.5)
Glucose: 87 mg/dL (ref 65–99)
Potassium: 4.3 mmol/L (ref 3.5–5.2)
Sodium: 141 mmol/L (ref 134–144)
Total Protein: 7.1 g/dL (ref 6.0–8.5)

## 2019-01-24 LAB — T3: T3, Total: 114 ng/dL (ref 71–180)

## 2019-01-24 LAB — VITAMIN D 25 HYDROXY (VIT D DEFICIENCY, FRACTURES): Vit D, 25-Hydroxy: 19.3 ng/mL — ABNORMAL LOW (ref 30.0–100.0)

## 2019-01-24 LAB — TSH: TSH: 2.05 u[IU]/mL (ref 0.450–4.500)

## 2019-01-24 LAB — INSULIN, RANDOM: INSULIN: 17 u[IU]/mL (ref 2.6–24.9)

## 2019-01-24 LAB — T4, FREE: Free T4: 1.4 ng/dL (ref 0.82–1.77)

## 2019-01-24 NOTE — Progress Notes (Addendum)
Office: [phone]  /  Fax: [phone]    Date: January 28, 2019   Appointment Start Time: 8:05am Duration: 25 minutes Provider: Lawerance Cruel, Psy.D. Type of Session: Individual Therapy  Location of Patient: Home Location of Provider: Provider's Home Type of Contact: Telepsychological Visit via [employer] WebEx   Session Content: Of note, this provider called [name] at 8:02am as she did not present for the WebEx appointment. She noted she did not see the e-mail; therefore, the e-mail with the secure link was re-sent. As such, today's appointment was initiated 5 minutes late. Prior to initiating telepsychological services, [name] was provided with an informed consent document, which included the development of a safety plan (i.e., an emergency contact and emergency resources) in the event of an emergency/crisis. Lori Boyd expressed understanding of the rationale of the safety plan and provided consent for this provider to reach out to her emergency contact in the event of an emergency/crisis. Lori Boyd returned the completed consent form prior to today's appointment. This provider verbally reviewed the consent form during today's appointment prior to proceeding with the appointment. Lori Boyd verbally acknowledged understanding that she is ultimately responsible for understanding her insurance benefits as it relates to reimbursement of telepsychological and in-person services. This provider also reviewed confidentiality, as it relates to telepsychological services, as well as the rationale for telepsychological services. More specifically, this provider's clinic is providing telepsychological services as an option for appointments to reduce exposure to COVID-19. [name] expressed understanding regarding the rationale for telepsychological services. In addition, this provider explained the telepsychological services informed consent document would be considered an addendum to the initial consent  document/service agreement. [name] verbally consented to proceed.    Lori Boyd is a 28 y.o. female presenting via [employer] WebEx for a follow-up appointment to address the previously established treatment goal of decreasing emotional eating. Today's appointment was a telepsychological visit, as it is an option for appointments to reduce exposure to COVID-19. Lori Boyd expressed understanding regarding the rationale for telepsychological services, and provided verbal consent for today's appointment. Prior to proceeding with today's appointment, Lori Boyd's physical location at the time of this appointment was obtained. In the event of technical difficulties, Lori Boyd shared a phone number she could be reached at. Young and this provider participated in today's telepsychological service. Also, Lori Boyd denied anyone else being present in the room or on the WebEx appointment.  This provider conducted a brief check-in. A risk assessment was completed. [name] denied experiencing suicidal and homicidal ideation, plan, and intent since the last appointment with this provider. She reported she continues to have easy access to the developed safety plan, and continues to acknowledge understanding regarding the importance of reaching out to trusted individuals and/or emergency resources if she is unable to ensure safety. Per chart review, Lori Boyd was referred for IOP, but declined. She was scheduled for outpatient individual psychotherapy and the appointment was indicated on the referral this provider placed for medication management. A separate referral for outpatient individual therapy was placed with [facility] Lori Boyd. Thus, Lori Boyd was advised to decline that referral when she receives a call and it was recommended she call [facility] to schedule an appointment with a psychiatrist as well for medication management. She agreed and requested this provider e-mail  contact information for [facility] at [place].  Furthermore, Lori Boyd shared her mood is "correlated" with what she eats. She noted she feels "happier in general" since starting the structured meal plan. Regarding emotional and  physical hunger, she noted, "Every time I noticed was on the left side [emotional hunger], which is when I eat." Psychoeducation regarding triggers for emotional eating was provided. Lori Boyd was provided a handout, and encouraged to utilize the handout between now and the next appointment to increase awareness of triggers and frequency. Lori Boyd agreed. This provider also discussed behavioral strategies for specific triggers, such as placing the utensil down when conversing to avoid mindless eating. Lori Boyd provided verbal consent during today's appointment for this provider to send willingness to explore triggers for emotional eating via e-mail. Overall,  Lori Boyd was receptive to today's session as evidenced by openness to sharing, responsiveness to feedback, and willingness to explore triggers for emotional eating.  Mental Status Examination:  Appearance: neat Behavior: cooperative Mood: euthymic Affect: mood congruent Speech: normal in rate, volume, and tone Eye Contact: appropriate Psychomotor Activity: appropriate Thought Process: linear, logical, and goal directed  Content/Perceptual Disturbances: denies suicidal and homicidal ideation, plan, and intent and no hallucinations, delusions, bizarre thinking or behavior reported or observed Orientation: time, person, place and purpose of appointment Cognition/Sensorium: memory, attention, language, and fund of knowledge intact  Insight: good Judgment: good  Interventions:  Conducted a brief chart review Provided empathic reflections and validation Reviewed content from the previous session Conducted a risk assessment Psychoeducation provided regarding triggers for emotional  eating Employed supportive psychotherapy interventions to facilitate reduced distress, and to improve coping skills with identified stressors  DSM-5 Diagnoses: 296.32 (F33.1) Major Depressive Disorder, Recurrent Episode, Moderate, With Anxious Distress and 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 Lori Boyd has demonstrated some progress in her goal as evidenced by increased awareness of hunger patterns.   Plan: Lori Boyd declined future appointments with this provider, as she will be initiating services for outpatient individual psychotherapy with [facility] on February 05, 2019. She acknowledged understanding that she may request a follow-up appointment with this provider in the future as long as she is still established with the clinic. No further follow-up planned by this provider.

## 2019-01-24 NOTE — Telephone Encounter (Signed)
D:  Nita Sells (CMA) referred pt to [facility].  A: Placed call to orient and provide pt with a start date.  Pt declined, stating she is wanting individual therapy.  Transferred pt to front desk since she resides here in [place].  R:  Pt receptive.

## 2019-01-28 ENCOUNTER — Ambulatory Visit (INDEPENDENT_AMBULATORY_CARE_PROVIDER_SITE_OTHER): Payer: BC Managed Care – PPO | Admitting: Psychology

## 2019-01-28 ENCOUNTER — Other Ambulatory Visit: Payer: Self-pay

## 2019-01-28 DIAGNOSIS — F331 Major depressive disorder, recurrent, moderate: Secondary | ICD-10-CM | POA: Diagnosis not present

## 2019-01-28 DIAGNOSIS — F509 Eating disorder, unspecified: Secondary | ICD-10-CM

## 2019-02-05 ENCOUNTER — Other Ambulatory Visit: Payer: Self-pay

## 2019-02-05 ENCOUNTER — Ambulatory Visit (INDEPENDENT_AMBULATORY_CARE_PROVIDER_SITE_OTHER): Payer: BC Managed Care – PPO | Admitting: Licensed Clinical Social Worker

## 2019-02-05 DIAGNOSIS — F331 Major depressive disorder, recurrent, moderate: Secondary | ICD-10-CM

## 2019-02-05 DIAGNOSIS — F419 Anxiety disorder, unspecified: Secondary | ICD-10-CM

## 2019-02-05 NOTE — Progress Notes (Signed)
Comprehensive Clinical Assessment (CCA) Note  02/05/2019 W[facility] [id number]  Visit Diagnosis:      ICD-10-CM   1. Major depressive disorder, recurrent episode, moderate (HCC)  F33.1   2. Anxiety disorder, unspecified type  F41.9     Rule Out Eating Disorder   CCA Part One  Part One has been completed on paper by the patient.  (See scanned document in Chart Review)  CCA Part Two A  Intake/Chief Complaint:  CCA Intake With Chief Complaint CCA Part Two Date: 02/05/19 Chief Complaint/Presenting Problem: Referral from PCP and Weight Management Patients Currently Reported Symptoms/Problems: depression, unhealthy relationship with food Collateral Involvement: EHR Individual's Strengths: employed, positive support system, willingness Individual's Preferences: outpatient therapy, medication management Type of Services Patient Feels Are Needed: outpatient Initial Clinical Notes/Concerns: See Below  Client is a 28 year old female presenting as a referral from Dr Donalee Citrin, PCP in addition to [facility] recommendations. Client endorses a history of unhealthy relationship with food as a response to stress. Client did see a therapist previously (6-8 months ago) but found it only somewhat successful. Client is interested in individual therapy and psychiatric medication management.  Client reports some daily stressors with work and finance as manageable. Client notes primarily problematic stressor which often results in unhealthy eating as coping, is relationship with family and obsessive thoughts related to these interactions. Client reports overall positive childhood with family until age 24 when she 'came out' about being a lesbian. Client's father, '[facility] pastor' had a pour response and continues to make comments about client being 'wicked.Marland Kitchengoing to hell..being an abomination.' Client reports an argument (family group text) the previous not father making a  comment 'brought up old hurt feelings..things never healed.no apology' from fathers ongoing unsupportive behavior. Client reports though overall positive relationship individually with her mother, the relationship is somewhat strained when mother stays quiet when father is being degrading. Client has a sister(35) who lives in [place], brother Matthew(31) and brother Sam(20). Client has supportive relationship with sister and youngest brother. Client sees Sam and his wife weekly with her wife. Client has been with her wife for 3 years and married for 2 who is supportive.  Client endorses depressive symptoms of sadness, lack of energy, changes in sleep/appetite ongoing from age 13, worsening in the past 2 years following ankle injury and increased weight gain. Client reports decrease in panic attacks and concerns with ruminating thoughts. Client has intermittent passive SI, never plan or intent. This is often increased with isolating after family arguments. Client denies history of sexual assault, some emotional abuse from previous girlfriend and verbal/emotional abuse from father related to her sexuality.  Client notes often craving and eating more at night, after dinner, and is starting to recognize eating when not hungry and make changes in behaviors over the past 2 weeks. Client reports prior to hurting ankle 2 years ago she worked out 'excessively' to manage weight with binge eating. Since accident client has gained 110 lbs in 2 years, effecting her depression, self-esteem, and decrease in sexual interest. Client is able to identify this is an unhealthy behavior and was not an appropriate way to manage weight.  Client reports weekly social alcohol use and smoking marijuana multiple times weekly to help with sleep.  Current medications from primary care include Buproprion xl 150 mg (AM), Certaline 50 mg (PM) recently decreased from  due to feeling 'cloudy' and a decrease in interest in sex. Client  notes increase in depressive sx  but is unsure if it is due to medication change or increase family discord. Client is also on propranolol for anxiety and a-fib. Client has a prescription for Xanax which she reports not taking often. Client is open to taking medications if needed but would like to take as little medication as possible.  Mental Health Symptoms Depression:  Depression: Difficulty Concentrating, Change in energy/activity, Increase/decrease in appetite, Weight gain/loss, Sleep (too much or little)  Mania:  Mania: N/A  Anxiety:   Anxiety: Difficulty concentrating, Worrying(ruminating thoughts)  Psychosis:  Psychosis: N/A  Trauma:  Trauma: (denies)  Obsessions:  Obsessions: Good insight(client reports thoughts as 'obsessive' however sound ruminating as there is no disruption in functioning or significant distress in ADLs)  Compulsions:  Compulsions: N/A  Inattention:  Inattention: N/A  Hyperactivity/Impulsivity:  Hyperactivity/Impulsivity: N/A  Oppositional/Defiant Behaviors:  Oppositional/Defiant Behaviors: N/A  Borderline Personality:  Emotional Irregularity: Recurrent suicidal behaviors/gestures/threats, Chronic feelings of emptiness, Unstable self-image(with self: confident but less lately due to weight)  Other Mood/Personality Symptoms:      Mental Status Exam Appearance and self-care  Stature:  Stature: Average  Weight:  Weight: Overweight  Clothing:  Clothing: Casual, Neat/clean  Grooming:  Grooming: Well-groomed  Cosmetic use:  Cosmetic Use: Excessive  Posture/gait:  Posture/Gait: Normal  Motor activity:  Motor Activity: Not Remarkable  Sensorium  Attention:  Attention: Normal  Concentration:  Concentration: Normal  Orientation:  Orientation: X5  Recall/memory:  Recall/Memory: Normal(reports some difficulty with remembering details of events, more long term)  Affect and Mood  Affect:  Affect: Appropriate  Mood:  Mood: Depressed  Relating  Eye contact:  Eye Contact:  Normal  Facial expression:  Facial Expression: Responsive  Attitude toward examiner:  Attitude Toward Examiner: Cooperative  Thought and Language  Speech flow: Speech Flow: Normal  Thought content:  Thought Content: Appropriate to mood and circumstances  Preoccupation:  Preoccupations: Ruminations(preocpation with stress related to things she could have done differently or family discord)  Hallucinations:  Hallucinations: Other (Comment)(none)  Organization:     Company secretary of Knowledge:  Fund of Knowledge: Average  Intelligence:  Intelligence: Average  Abstraction:  Abstraction: Normal  Judgement:  Judgement: Normal  Reality Testing:  Reality Testing: Adequate, Realistic  Insight:  Insight: Good  Decision Making:  Decision Making: Normal  Social Functioning  Social Maturity:  Social Maturity: Responsible  Social Judgement:  Social Judgement: Normal  Stress  Stressors:  Stressors: Family conflict, Illness  Coping Ability:  Coping Ability: Science writer, Engineer, agricultural Deficits:     Supports:      Family and Psychosocial History: Family history Marital status: Married Number of Years Married: 2 What types of issues is patient dealing with in the relationship?: none Additional relationship information: wife supportive Are you sexually active?: Yes What is your sexual orientation?: lesbian Has your sexual activity been affected by drugs, alcohol, medication, or emotional stress?: emotional stress affected sex life: decrease in sexual activity when depressed Does patient have children?: No  Childhood History:  Childhood History By whom was/is the patient raised?: Both parents Additional childhood history information: overall positive relationship with family growing up. relationship became strained with parents at client age 8 when notified family she is a lesbian Description of patient's relationship with caregiver when they were a child: positive as a child  though client reports 'feeling like i had to hide who i was or be 2 different people' Patient's description of current relationship with people who raised him/her: strained with father who  often says hurtful things; speaks with mom regularly but is upset by mom bein quite and not wanting to start trouble when client and father argue How were you disciplined when you got in trouble as a child/adolescent?: appropriate Does patient have siblings?: Yes Number of Siblings: 3 Description of patient's current relationship with siblings: positive Did patient suffer any verbal/emotional/physical/sexual abuse as a child?: No Did patient suffer from severe childhood neglect?: No Has patient ever been sexually abused/assaulted/raped as an adolescent or adult?: No Was the patient ever a victim of a crime or a disaster?: No Witnessed domestic violence?: No Has patient been effected by domestic violence as an adult?: No  CCA Part Two B  Employment/Work Situation: Employment / Work Copywriter, advertising Employment situation: Employed Where is patient currently employed?: [employer] How long has patient been employed?: 6-8 months Patient's job has been impacted by current illness: No(reports is able to complete tasks but some days unable to do anything else with depression) What is the longest time patient has a held a job?: 2 years 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 Did You Receive Any Psychiatric Treatment/Services While in the [employer]?: No Are There Guns or Other Weapons in [place]?: No  Education: Museum/gallery curator Currently Attending: NA Last Grade Completed: 13 Did Teacher, adult education From [employer]?: Yes Did Physicist, medical?: Yes What Type of College Degree Do you Have?: Associate in [employer] Did [place]?: No Did You Have An Individualized Education Program (IIEP): No Did You Have Any Difficulty At School?: No  Religion: Religion/Spirituality Are You A Religious  Person?: Yes How Might This Affect Treatment?: Reports difficulty with religion/spirituality due to fathers comments on client going to hell or being an abomination due to being a lesbian. Continues to be spiritual but not attend religious services.(Father's negative commenting about client's sexuality has been problematic and pushed her away from the church)  Leisure/Recreation: Leisure / Recreation Leisure and Hobbies: crafting, walking the dog, puzzles  Exercise/Diet: Exercise/Diet Do You Exercise?: Yes(walking 4-7 times weekly depending on weather and depression. Hx of overexercising to compensate for binge eating.) What Type of Exercise Do You Do?: Run/Walk(walking dog. ankle injury 2 years ago caused ecrease in movement and exercise routine) How Many Times a Week Do You Exercise?: 4-5 times a week Have You Gained or Lost A Significant Amount of Weight in the Past Six Months?: Yes-Gained(110 pounds in 2 years (after ankle injury)) Do You Follow a Special Diet?: No Do You Have Any Trouble Sleeping?: Yes Explanation of Sleeping Difficulties: difficulty calming thoughts, some marijuana use to help fall asleep. able to stay asleep  CCA Part Two C  Alcohol/Drug Use: Alcohol / Drug Use Pain Medications: denies Prescriptions: denies Over the Counter: denies History of alcohol / drug use?: Yes Longest period of sobriety (when/how long): 6-8 monts+ Negative Consequences of Use: (none) Withdrawal Symptoms: (none) Substance #1 Name of Substance 1: alcohol 1 - Age of First Use: 'highschool' 1 - Amount (size/oz): 1-3 drinks 1 - Frequency: 3 weekends per month 1 - Duration: 7 years total (some binging in college on weekends, less than age 89) 1 - Last Use / Amount: more than 2 weeks Substance #2 Name of Substance 2: marijuana 2 - Amount (size/oz): 1/2 gm 2 - Frequency: 3 times weekly 2 - Last Use / Amount: 2 weeks    CCA Part Three  ASAM's:  Six Dimensions of Multidimensional  Assessment  Dimension 1:  Acute Intoxication and/or Withdrawal Potential:  Dimension 1:  Comments: 0  Dimension 2:  Biomedical Conditions and Complications:  Dimension 2:  Comments: 1 overweight, engaged with weightloss clinic, concerns about losing weight, hx poor relationship with food  Dimension 3:  Emotional, Behavioral, or Cognitive Conditions and Complications:  Dimension 3:  Comments: 1 marijuana used for sleep, ETOH on weekends; hx of binging in college  Dimension 4:  Readiness to Change:  Dimension 4:  Comments: 0 willing to be sober with other skills to help  Dimension 5:  Relapse, Continued use, or Continued Problem Potential:  Dimension 5:  Comments: 1 use to sleep  Dimension 6:  Recovery/Living Environment:  Dimension 6:  Recovery/Living Environment Comments: family increased stressed   Substance use Disorder (SUD)  No recommended substance specific treatment  Social Function:  Social Functioning Social Maturity: Responsible Social Judgement: Normal  Stress:  Stress Stressors: Family conflict, Illness Coping Ability: Overwhelmed, Resilient Patient Takes Medications The Way The Doctor Instructed?: Yes Priority Risk: Moderate Risk  Risk Assessment- Self-Harm Potential: Risk Assessment For Self-Harm Potential Thoughts of Self-Harm: Vague current thoughts(most recent 02/04/19 after family argument. denies any current or previous thoughts with plan/intent) Method: No plan  Risk Assessment -Dangerous to Others Potential: Risk Assessment For Dangerous to Others Potential Method: No Plan  DSM5 Diagnoses: Patient Active Problem List   Diagnosis Date Noted  . Vitamin D deficiency 01/24/2019  . Loud snoring 11/12/2018  . Social alcohol use 11/12/2018  . Anxiety and depression 11/12/2018  . Anticoagulation adequate 10/04/2018  . Morbid obesity (HCC) 10/04/2018  . HLD (hyperlipidemia) 10/04/2018  . PAF (paroxysmal atrial fibrillation) (HCC) 10/03/2018  . Subluxation of  peroneal tendon of left foot 12/14/2016  . Sprain of anterior talofibular ligament of left ankle 11/24/2016    Patient Centered Plan: Patient is on the following Treatment Plan(s):  Depression, Impulse Control and Low Self-Esteem  Recommendations for Services/Supports/Treatments: Recommendations for Services/Supports/Treatments Recommendations For Services/Supports/Treatments: Individual Therapy, Medication Management  Treatment Plan Summary:  Client will increase use of healthy coping skills to at least 1 time per day at least 4 times per week. Client will improve self esteem and decrease use of unhealthy eating patterns.  Referrals to Alternative Service(s): Referred to Alternative Service(s):   Place:   Date:   Time:    Referred to Alternative Service(s):   Place:   Date:   Time:    Referred to Alternative Service(s):   Place:   Date:   Time:    Referred to Alternative Service(s):   Place:   Date:   Time:     Harlon DittyKarissa A Brone, LCSW, LCAS

## 2019-02-06 ENCOUNTER — Ambulatory Visit (INDEPENDENT_AMBULATORY_CARE_PROVIDER_SITE_OTHER): Payer: BC Managed Care – PPO | Admitting: Bariatrics

## 2019-02-06 ENCOUNTER — Other Ambulatory Visit: Payer: Self-pay

## 2019-02-06 VITALS — BP 120/84 | HR 73 | Temp 98.0°F | Ht 72.0 in | Wt 382.0 lb

## 2019-02-06 DIAGNOSIS — E8881 Metabolic syndrome: Secondary | ICD-10-CM

## 2019-02-06 DIAGNOSIS — F3289 Other specified depressive episodes: Secondary | ICD-10-CM | POA: Diagnosis not present

## 2019-02-06 DIAGNOSIS — Z9189 Other specified personal risk factors, not elsewhere classified: Secondary | ICD-10-CM | POA: Diagnosis not present

## 2019-02-06 DIAGNOSIS — E559 Vitamin D deficiency, unspecified: Secondary | ICD-10-CM | POA: Diagnosis not present

## 2019-02-06 DIAGNOSIS — Z6841 Body Mass Index (BMI) 40.0 and over, adult: Secondary | ICD-10-CM

## 2019-02-06 MED ORDER — VITAMIN D (ERGOCALCIFEROL) 1.25 MG (50000 UNIT) PO CAPS: 50000.0000 [IU] | ORAL_CAPSULE | ORAL | 0 refills | Status: AC

## 2019-02-07 NOTE — Progress Notes (Signed)
Office: [phone]  /  Fax: [phone]   HPI:   Chief Complaint: OBESITY Lori Boyd is here to discuss her progress with her obesity treatment plan. She is on the Category 4 plan with additional breakfast options and is following her eating plan approximately 90 % of the time. She states she is walking for 60 minutes 5 times per week. [name] is down 6 lbs. It was a lot of food for the morning. She has some mild cravings after dinner.  Her weight is (!) 382 lb (173.3 kg) today and has had a weight loss of 6 pounds over a period of 2 weeks since her last visit. She has lost 6 lbs since starting treatment with [place].  Vitamin D Deficiency Lori Boyd has a diagnosis of vitamin D deficiency. She is currently taking prescription Vit D and notes mostly good energy. Last Vit D level was 19.3. She denies nausea, vomiting or muscle weakness.  At risk for osteopenia and osteoporosis Lori Boyd is at higher risk of osteopenia and osteoporosis due to vitamin D deficiency.   Insulin Resistance Lori Boyd has a new diagnosis of insulin resistance based on her elevated fasting insulin level >5. Last A1c was 5.2 on 10/04/2018, and insulin of 17.0. Although Lori Boyd's blood glucose readings are still under good control, insulin resistance puts her at greater risk of metabolic syndrome and diabetes. She is not taking metformin currently and continues to work on diet and exercise to decrease risk of diabetes. She denies polyphagia.  Depression with Emotional Eating Behaviors Lori Boyd has seen Dr. Dewaine Conger, our Bariatric Psychologist. Lori Boyd struggles with emotional eating and using food for comfort to the extent that it is negatively impacting her health. She often snacks when she is not hungry. Lori Boyd sometimes feels she is out of control and then feels guilty that she made poor food choices. She has been working on behavior modification techniques to help reduce her emotional eating and has been somewhat successful. She  shows no sign of suicidal or homicidal ideations.  Depression screen [facility] 2/9 01/23/2019 11/12/2018  Decreased Interest 3 1  Down, Depressed, Hopeless 3 1  PHQ - 2 Score 6 2  Altered sleeping 2 2  Tired, decreased energy 3 2  Change in appetite 3 3  Feeling bad or failure about yourself  3 2  Trouble concentrating 2 1  Moving slowly or fidgety/restless 0 0  Suicidal thoughts 3 0  PHQ-9 Score 22 12  Difficult doing work/chores Extremely dIfficult -    ASSESSMENT AND PLAN:  Vitamin D deficiency - Plan: Vitamin D, Ergocalciferol, (DRISDOL) 1.25 MG (50000 UT) CAPS capsule  Insulin resistance  Other depression - with emotional eating   At risk for osteoporosis  Class 3 severe obesity with serious comorbidity and body mass index (BMI) of 50.0 to 59.9 in adult, unspecified obesity type (HCC)  PLAN:  Vitamin D Deficiency [name] was informed that low vitamin D levels contributes to fatigue and are associated with obesity, breast, and colon cancer. [name] agrees to continue taking prescription Vit D 50,000 IU every week #4 and we will refill for 1 month. She will follow up for routine testing of vitamin D, at least 2-3 times per year. She was informed of the risk of over-replacement of vitamin D and agrees to not increase her dose unless she discusses this with [place] first. [name] agrees to follow up with our clinic in 2 to 3 weeks.  At risk for osteopenia and osteoporosis [name] was given extended (15 minutes) osteoporosis  prevention counseling today. [name] is at risk for osteopenia and osteoporsis due to her vitamin D deficiency. She was encouraged to take her vitamin D and follow her higher calcium diet and increase strengthening exercise to help strengthen her bones and decrease her risk of osteopenia and osteoporosis.  Insulin Resistance Lori Boyd will continue to work on weight loss, exercise, increase protein, and decreasing simple carbohydrates in her diet to help decrease the  risk of diabetes. We dicussed metformin including benefits and risks. She was informed that eating too many simple carbohydrates or too many calories at one sitting increases the likelihood of GI side effects. Insulin resistance and pre-diabetes information was given today. Lori Boyd agrees to follow up with [place] as directed to monitor her progress.  Depression with Emotional Eating Behaviors We discussed cognitive behavioral therapy techniques today to help Lori Boyd deal with her emotional eating and depression. Lori Boyd is to continue to follow up with Dr. [name], and we will refer to another therapist as well.  Obesity Lori Boyd is currently in the action stage of change. As such, her goal is to continue with weight loss efforts She has agreed to follow the Category 4 plan with additional breakfast options Lori Boyd has been instructed to work up to a goal of 150 minutes of combined cardio and strengthening exercise per week for weight loss and overall health benefits. We discussed the following Behavioral Modification Strategies today: increasing lean protein intake, decreasing simple carbohydrates, increasing vegetables, decrease eating out, work on meal planning and easy cooking plans, holiday eating strategies, celebration eating strategies, increase H20 intake, no skipping meals, and keeping healthy foods in the home Lori Boyd was given additional lunch options and Thanksgiving information.  Lori Boyd has agreed to follow up with our clinic in 2 to 3 weeks. She was informed of the importance of frequent follow up visits to maximize her success with intensive lifestyle modifications for her multiple health conditions.  ALLERGIES: No Known Allergies  MEDICATIONS: Current Outpatient Medications on File Prior to Visit  Medication Sig Dispense Refill   ALPRAZolam (XANAX) 0.25 MG tablet Take 0.25 mg by mouth 2 (two) times daily as needed for anxiety.     buPROPion (WELLBUTRIN XL) 150 MG 24 hr tablet Take  150 mg by mouth daily.     flecainide (TAMBOCOR) 150 MG tablet Take 2 tablets (300 mg total) by mouth once as needed (atrial fib). 2 tablet 3   propranolol (INDERAL) 80 MG tablet Take 80 mg by mouth at bedtime.     sertraline (ZOLOFT) 100 MG tablet Take 50 mg by mouth daily.     No current facility-administered medications on file prior to visit.     PAST MEDICAL HISTORY: Past Medical History:  Diagnosis Date   Anticoagulation adequate 10/04/2018   Anxiety    Atrial fibrillation ([place]) 10/03/2018   Depression    HLD (hyperlipidemia) 10/04/2018   Left ankle pain    Migraine    Morbid obesity ([place]) 10/04/2018   Multifocal choroiditis    Recurrent sinus infections     PAST SURGICAL HISTORY: Past Surgical History:  Procedure Laterality Date   left ankle reconstruction Left 01/03/2017   Dr. Sharol Given   NASAL SINUS SURGERY     WISDOM TOOTH EXTRACTION      SOCIAL HISTORY: Social History   Tobacco Use   Smoking status: Former Smoker   Smokeless tobacco: Never Used  Substance Use Topics   Alcohol use: Yes    Comment: a few times but in excess  Drug use: Yes    Types: Marijuana    Comment: a few times a mth for recreaion    FAMILY HISTORY: Family History  Problem Relation Age of Onset   Healthy Mother    Thyroid disease Mother    Obesity Mother    Atrial fibrillation Father    Migraines Father    Migraines Sister     ROS: Review of Systems  Constitutional: Positive for weight loss.  Gastrointestinal: Negative for nausea and vomiting.  Musculoskeletal:       Negative muscle weakness  Endo/Heme/Allergies:       Negative polyphagia  Psychiatric/Behavioral: Positive for depression. Negative for suicidal ideas.    PHYSICAL EXAM: Blood pressure 120/84, pulse 73, temperature 98 F (36.7 C), height 6' (1.829 m), weight (!) 382 lb (173.3 kg), last menstrual period 12/11/2018, SpO2 99 %. Body mass index is 51.81 kg/m. Physical Exam Vitals signs  reviewed.  Constitutional:      Appearance: Normal appearance. She is obese.  Cardiovascular:     Rate and Rhythm: Normal rate.     Pulses: Normal pulses.  Pulmonary:     Effort: Pulmonary effort is normal.     Breath sounds: Normal breath sounds.  Musculoskeletal: Normal range of motion.  Skin:    General: Skin is warm and dry.  Neurological:     Mental Status: She is alert and oriented to person, place, and time.  Psychiatric:        Mood and Affect: Mood normal.        Behavior: Behavior normal.     RECENT LABS AND TESTS: BMET    Component Value Date/Time   NA 141 01/23/2019 1115   K 4.3 01/23/2019 1115   CL 105 01/23/2019 1115   CO2 21 01/23/2019 1115   GLUCOSE 87 01/23/2019 1115   GLUCOSE 93 10/04/2018 0315   BUN 11 01/23/2019 1115   CREATININE 0.75 01/23/2019 1115   CALCIUM 9.4 01/23/2019 1115   GFRNONAA 111 01/23/2019 1115   GFRAA 128 01/23/2019 1115   Lab Results  Component Value Date   HGBA1C 5.2 10/04/2018   Lab Results  Component Value Date   INSULIN 17.0 01/23/2019   CBC    Component Value Date/Time   WBC 10.8 (H) 10/04/2018 0315   RBC 4.29 10/04/2018 0315   HGB 12.9 10/04/2018 0315   HCT 38.0 10/04/2018 0315   PLT 334 10/04/2018 0315   MCV 88.6 10/04/2018 0315   MCH 30.1 10/04/2018 0315   MCHC 33.9 10/04/2018 0315   RDW 12.1 10/04/2018 0315   Iron/TIBC/Ferritin/ %Sat No results found for: IRON, TIBC, FERRITIN, IRONPCTSAT Lipid Panel     Component Value Date/Time   CHOL 196 10/04/2018 0315   TRIG 277 (H) 10/04/2018 0315   HDL 40 (L) 10/04/2018 0315   CHOLHDL 4.9 10/04/2018 0315   VLDL 55 (H) 10/04/2018 0315   LDLCALC 101 (H) 10/04/2018 0315   Hepatic Function Panel     Component Value Date/Time   PROT 7.1 01/23/2019 1115   ALBUMIN 4.4 01/23/2019 1115   AST 22 01/23/2019 1115   ALT 26 01/23/2019 1115   ALKPHOS 111 01/23/2019 1115   BILITOT 0.5 01/23/2019 1115   BILIDIR <0.1 10/03/2018 2144   IBILI NOT CALCULATED 10/03/2018 2144       Component Value Date/Time   TSH 2.050 01/23/2019 1115   TSH 1.435 10/03/2018 1800      OBESITY BEHAVIORAL INTERVENTION VISIT  Today's visit was # 2   Starting  weight: 388 lbs Starting date: 01/23/2019 Today's weight : 382 lbs Today's date: 02/06/2019 Total lbs lost to date: 6    ASK: We discussed the diagnosis of obesity with Chandlar Lahoma RockerSherman today and Rosalinda agreed to give us permission to discuss obesity behavioral modification therapy today.  ASSESS: Delmar has the diagnosis of obesity and her BMI today is 51.8 Shonteria is in the action stage of change   ADVISE: Neyda was educated on the multiple health risks of obesity as well as the benefit of weight loss to improve her health. She was advised of the need for long term treatment and the importance of lifestyle modifications to improve her current health and to decrease her risk of future health problems.  AGREE: Multiple dietary modification options and treatment options were discussed and  Emilea agreed to follow the recommendations documented in the above note.  ARRANGE: Tayleigh was educated on the importance of frequent visits to treat obesity as outlined per CMS and USPSTF guidelines and agreed to schedule her next follow up appointment today.  Trude McburneyI, Sharon Martin, am acting as transcriptionist for [employer] Aylee Littrell, DO  I have reviewed the above documentation for accuracy and completeness, and I agree with the above. -Corinna CapraAngel Masao Junker, DO

## 2019-02-11 ENCOUNTER — Encounter (INDEPENDENT_AMBULATORY_CARE_PROVIDER_SITE_OTHER): Payer: Self-pay | Admitting: Bariatrics

## 2019-02-12 ENCOUNTER — Other Ambulatory Visit: Payer: Self-pay

## 2019-02-12 ENCOUNTER — Ambulatory Visit (INDEPENDENT_AMBULATORY_CARE_PROVIDER_SITE_OTHER): Payer: BC Managed Care – PPO | Admitting: Licensed Clinical Social Worker

## 2019-02-12 DIAGNOSIS — F331 Major depressive disorder, recurrent, moderate: Secondary | ICD-10-CM | POA: Diagnosis not present

## 2019-02-12 DIAGNOSIS — F419 Anxiety disorder, unspecified: Secondary | ICD-10-CM | POA: Diagnosis not present

## 2019-02-12 NOTE — Progress Notes (Signed)
Virtual Visit via Video Note  I connected with Lori Boyd on 02/12/19 at 10:00 AM EST by a video enabled telemedicine application and verified that I am speaking with the correct person using two identifiers.   I discussed the limitations of evaluation and management by telemedicine and the availability of in person appointments. The patient expressed understanding and agreed to proceed.  I discussed the assessment and treatment plan with the patient. The patient was provided an opportunity to ask questions and all were answered. The patient agreed with the plan and demonstrated an understanding of the instructions.   The patient was advised to call back or seek an in-person evaluation if the symptoms worsen or if the condition fails to improve as anticipated.  I provided 45 minutes of non-face-to-face time during this encounter.   Olegario Messier, LCSW    THERAPIST PROGRESS NOTE  Session Time: 10am-10:45pm  Participation Level: Active  Behavioral Response: NeatAlertDysphoric  Type of Therapy: Individual Therapy  Treatment Goals addressed: Coping and Diagnosis: Increase use of healthy coping skills to at least 1 time daily at least 3 days per week to decrease symtpoms of depression and impulsive behaviors.  Interventions: CBT and Supportive  Summary: Lori Boyd is a 28 y.o. female who presents with symptoms of depression, anxiety, and impulse control. Client shared weekend events and indulging when around peers who were drinking excessively. Client identified unhelpful thoughts which often accompany feelings of guilt resulting in snacking when not hungry. Client is receptive to keeping thought/craving journal. Client shares listening to podcasts sometimes to help her sleep and is open to trying paired breathing. Client verbalized understanding of CBT triangle and is able to reframe a distorted thought from previous evening.  Suicidal/Homicidal: Nowithout  intent/plan  Therapist Response: Clinician met with client via telehealth. Clinician assessed client for SI/HI/psychosis and overall level of functioning. Clinician inquired about recent stressors and patterns of impulsive behaviors including drinking, shopping, and eating. Clinician presented cognitive triangle and connection of thoughts, feelings, and behaviors. Clinician encouraged client to keep journal of thoughts and feelings when oversnacking or wanting to indulge at night. Clinician provided some psychoeducational information about depression and patterns and allowed client time to process diagnosis and need for medication based on family beliefs growing up. Clinician worked with client to provide an example of addressing automatic negative thought with realistic thoughts.  Plan: Return again in 1 weeks.  Diagnosis: Axis I: Major Depressive disorder, moderate        Olegario Messier, LCSW 02/12/2019

## 2019-02-14 ENCOUNTER — Encounter (INDEPENDENT_AMBULATORY_CARE_PROVIDER_SITE_OTHER): Payer: Self-pay

## 2019-02-14 ENCOUNTER — Ambulatory Visit (INDEPENDENT_AMBULATORY_CARE_PROVIDER_SITE_OTHER): Payer: BC Managed Care – PPO | Admitting: Psychology

## 2019-02-20 ENCOUNTER — Ambulatory Visit (HOSPITAL_COMMUNITY): Payer: BC Managed Care – PPO | Admitting: Psychiatry

## 2019-02-20 ENCOUNTER — Other Ambulatory Visit: Payer: Self-pay

## 2019-02-28 ENCOUNTER — Ambulatory Visit (INDEPENDENT_AMBULATORY_CARE_PROVIDER_SITE_OTHER): Payer: BC Managed Care – PPO | Admitting: Bariatrics

## 2019-03-12 ENCOUNTER — Ambulatory Visit: Payer: BC Managed Care – PPO | Admitting: Internal Medicine

## 2019-04-01 ENCOUNTER — Ambulatory Visit (INDEPENDENT_AMBULATORY_CARE_PROVIDER_SITE_OTHER)
Admission: RE | Admit: 2019-04-01 | Discharge: 2019-04-01 | Disposition: A | Discharge status: Home / Self Care | Payer: BC Managed Care – PPO | Source: Ambulatory Visit

## 2019-04-01 DIAGNOSIS — L02211 Cutaneous abscess of abdominal wall: Secondary | ICD-10-CM

## 2019-04-01 MED ORDER — DOXYCYCLINE HYCLATE 100 MG PO CAPS: 100.0000 mg | ORAL_CAPSULE | Freq: Two times a day (BID) | ORAL | 0 refills | Status: AC

## 2019-04-01 NOTE — Discharge Instructions (Addendum)
Apply warm compresses 3-4x daily for 10-15 minutes Wash site daily with warm water and mild soap Keep covered to avoid friction Take antibiotic as prescribed and to completion Follow up here or with PCP if symptoms persists Follow up in person or go to the ED if you have any new or worsening symptoms fever, chills, nausea, vomiting, increased redness/ swelling/ pain, symptoms do not improve with medication, etc..Marland Kitchen

## 2019-04-01 NOTE — ED Provider Notes (Signed)
[facility]     Virtual Visit via Video Note:  Lori Boyd  initiated request for Telemedicine visit with Lori Boyd team. I connected with Lori Boyd  on 04/01/2019 at 2:08 PM  for a synchronized telemedicine visit using a video enabled HIPPA compliant telemedicine application. I verified that I am speaking with Lori Boyd  using two identifiers. Lori Harding, PA-C  was physically located in a [facility] site and Lori Boyd was located at a different location.   The limitations of evaluation and management by telemedicine as well as the availability of in-person appointments were discussed. Patient was informed that she  may incur a bill ( including co-pay) for this virtual visit encounter. Lori Boyd  expressed understanding and gave verbal consent to proceed with virtual visit.   [id number] 04/01/19 Arrival Time: 1355  CC: Abscess  SUBJECTIVE:  Lori Boyd is a 29 y.o. female who presents with possible abscess x 3 days. Denies precipitating event or trauma.   Localizes the abscess to lower abdomen.  Describes it as painful, red and spreading.  Has tried ibuprofen and warm compresses with minimal relief.  Symptoms are made worse to the touch.  Reports similar symptoms in the past that improved with antibiotics.   Denies fever, chills, nausea, vomiting, discharge, changes in bowel or bladder function.    ROS: As per HPI.  All other pertinent ROS negative.     Past Medical History:  Diagnosis Date  . Anticoagulation adequate 10/04/2018  . Anxiety   . Atrial fibrillation (HCC) 10/03/2018  . Depression   . HLD (hyperlipidemia) 10/04/2018  . Left ankle pain   . Migraine   . Morbid obesity (HCC) 10/04/2018  . Multifocal choroiditis   . Recurrent sinus infections    Past Surgical History:  Procedure Laterality Date  . left ankle reconstruction Left 01/03/2017   Dr. Lajoyce Corners  . NASAL SINUS SURGERY    . WISDOM TOOTH EXTRACTION      No Known Allergies No current facility-administered medications on file prior to encounter.   Current Outpatient Medications on File Prior to Encounter  Medication Sig Dispense Refill  . ALPRAZolam (XANAX) 0.25 MG tablet Take 0.25 mg by mouth 2 (two) times daily as needed for anxiety.    Marland Kitchen buPROPion (WELLBUTRIN XL) 150 MG 24 hr tablet Take 150 mg by mouth daily.    . flecainide (TAMBOCOR) 150 MG tablet Take 2 tablets (300 mg total) by mouth once as needed (atrial fib). 2 tablet 3  . propranolol (INDERAL) 80 MG tablet Take 80 mg by mouth at bedtime.    . sertraline (ZOLOFT) 100 MG tablet Take 50 mg by mouth daily.    . Vitamin D, Ergocalciferol, (DRISDOL) 1.25 MG (50000 UT) CAPS capsule Take 1 capsule (50,000 Units total) by mouth every 7 (seven) days. 4 capsule 0    OBJECTIVE: There were no vitals filed for this visit.  General appearance: alert; no distress Eyes: EOMI grossly HENT: normocephalic; atraumatic Neck: supple with FROM Lungs: normal respiratory effort; speaking in full sentences without difficulty Extremities: moves extremities without difficulty Skin: Abscess present to lower abdomen with surrounding erythema (see picture below) Neurologic: normal facial expressions Psychological: alert and cooperative; normal mood and affect    ASSESSMENT & PLAN:  1. Abscess of skin of abdomen     Meds ordered this encounter  Medications  . doxycycline (VIBRAMYCIN) 100 MG capsule    Sig: Take 1 capsule (100 mg total) by mouth  2 (two) times daily.    Dispense:  20 capsule    Refill:  0    Order Specific Question:   Supervising Provider    Answer:   Raylene Everts [[id number]]   Apply warm compresses 3-4x daily for 10-15 minutes Wash site daily with warm water and mild soap Keep covered to avoid friction Take antibiotic as prescribed and to completion Follow up here or with PCP if symptoms persists Follow up in person or go to the ED if you have any new or worsening  symptoms fever, chills, nausea, vomiting, increased redness/ swelling/ pain, symptoms do not improve with medication, etc...  I discussed the assessment and treatment plan with the patient. The patient was provided an opportunity to ask questions and all were answered. The patient agreed with the plan and demonstrated an understanding of the instructions.   The patient was advised to call back or seek an in-person evaluation if the symptoms worsen or if the condition fails to improve as anticipated.  I provided 13 minutes of non-face-to-face time during this encounter.  [place], PA-C  04/01/2019 2:08 PM    [name], PA-C 04/01/19 1409

## 2019-08-13 ENCOUNTER — Telehealth: Payer: Self-pay | Admitting: Cardiology

## 2019-08-13 NOTE — Telephone Encounter (Signed)
Left message for patient on 08/13/19, to return call to schedule f/u appointment. 

## 2019-11-26 ENCOUNTER — Telehealth: Payer: Self-pay | Admitting: *Deleted

## 2019-11-26 NOTE — Telephone Encounter (Signed)
A message was left, re: her follow up visit. 

## 2020-04-21 ENCOUNTER — Encounter: Payer: Self-pay | Admitting: Cardiology

## 2020-07-30 IMAGING — DX CHEST - 2 VIEW
2 series · 2 of 2 positions shown · non-contrast
Comparison: None.

CLINICAL DATA: Onset shortness of breath and chest pain today.

EXAM:
CHEST - 2 VIEW

[chest pa]
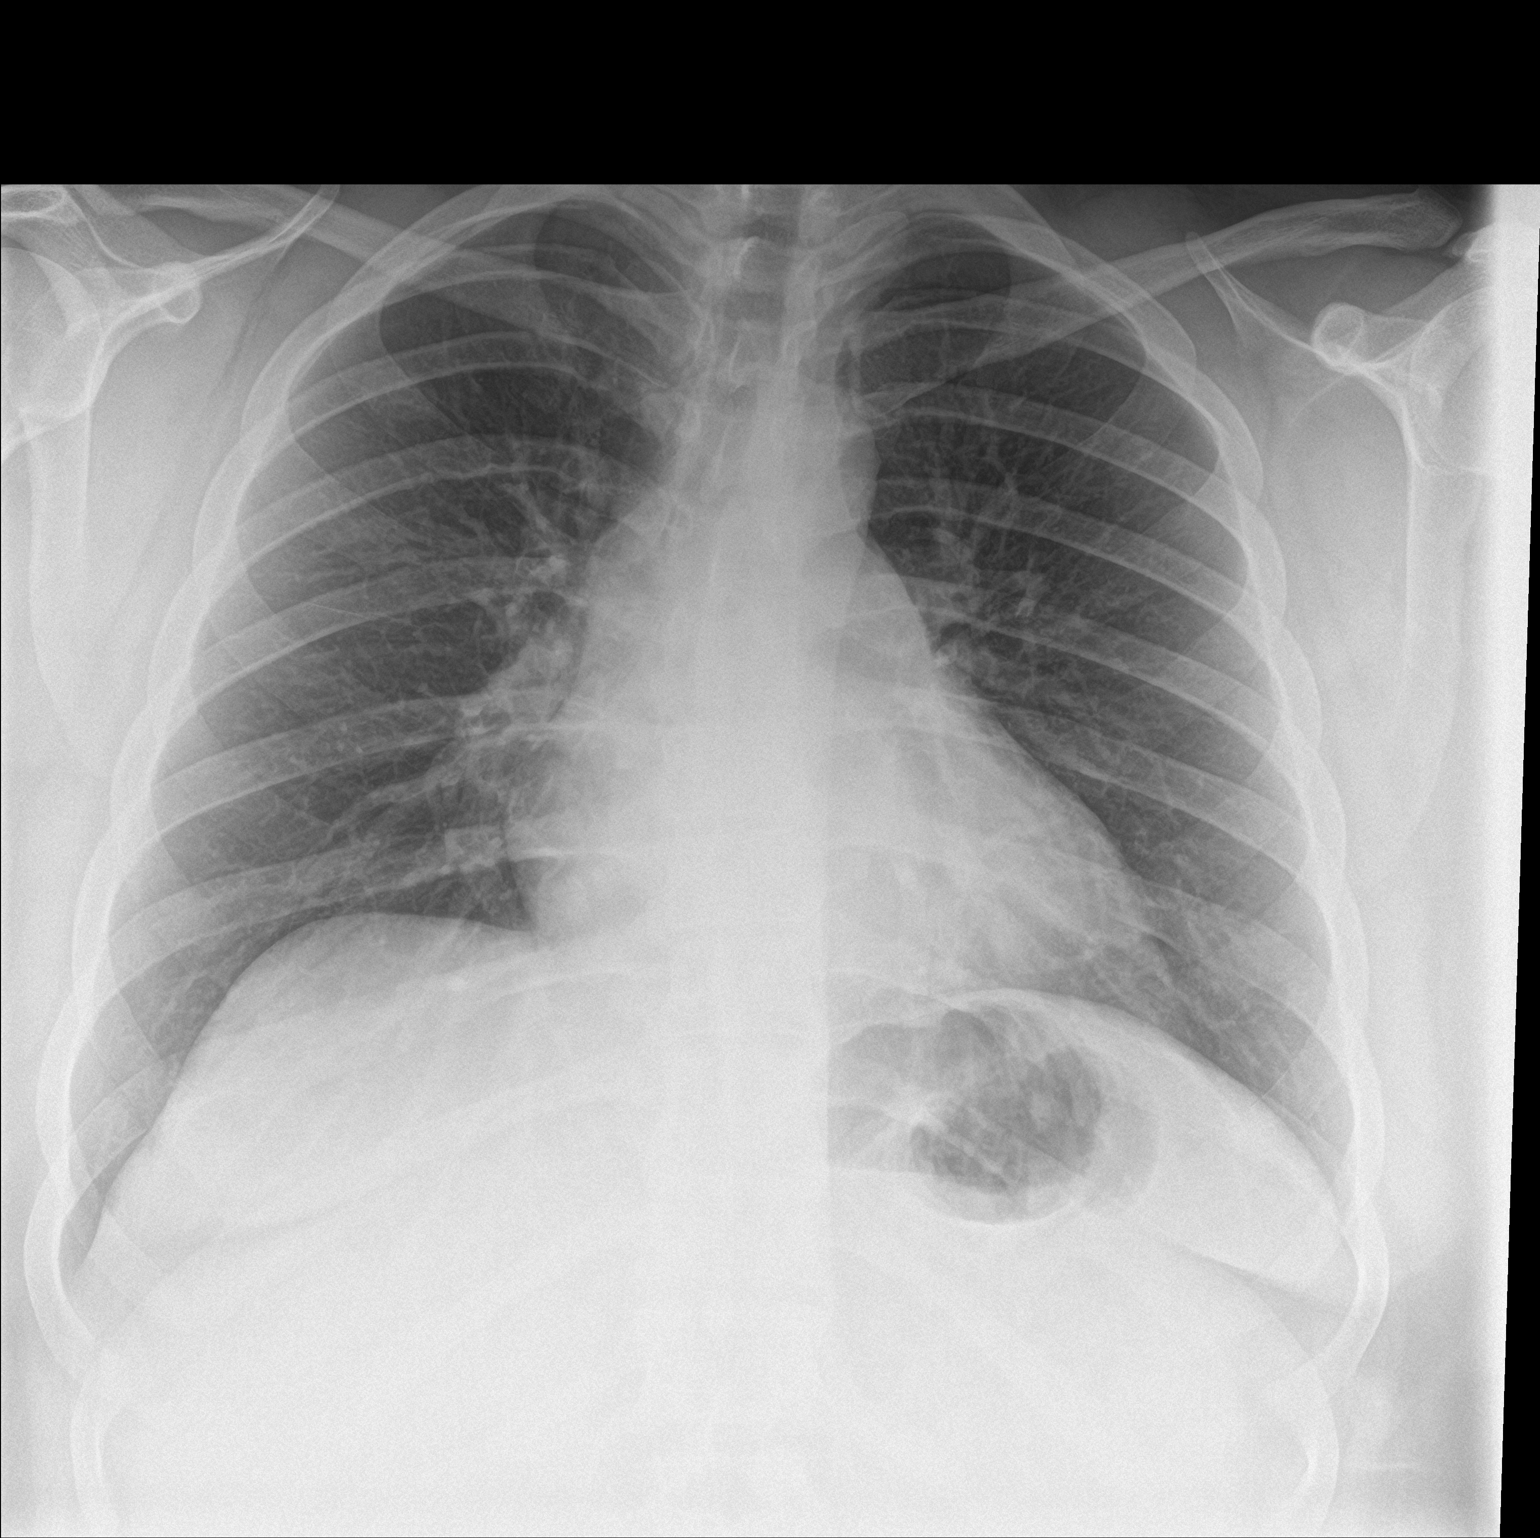

[chest lat]
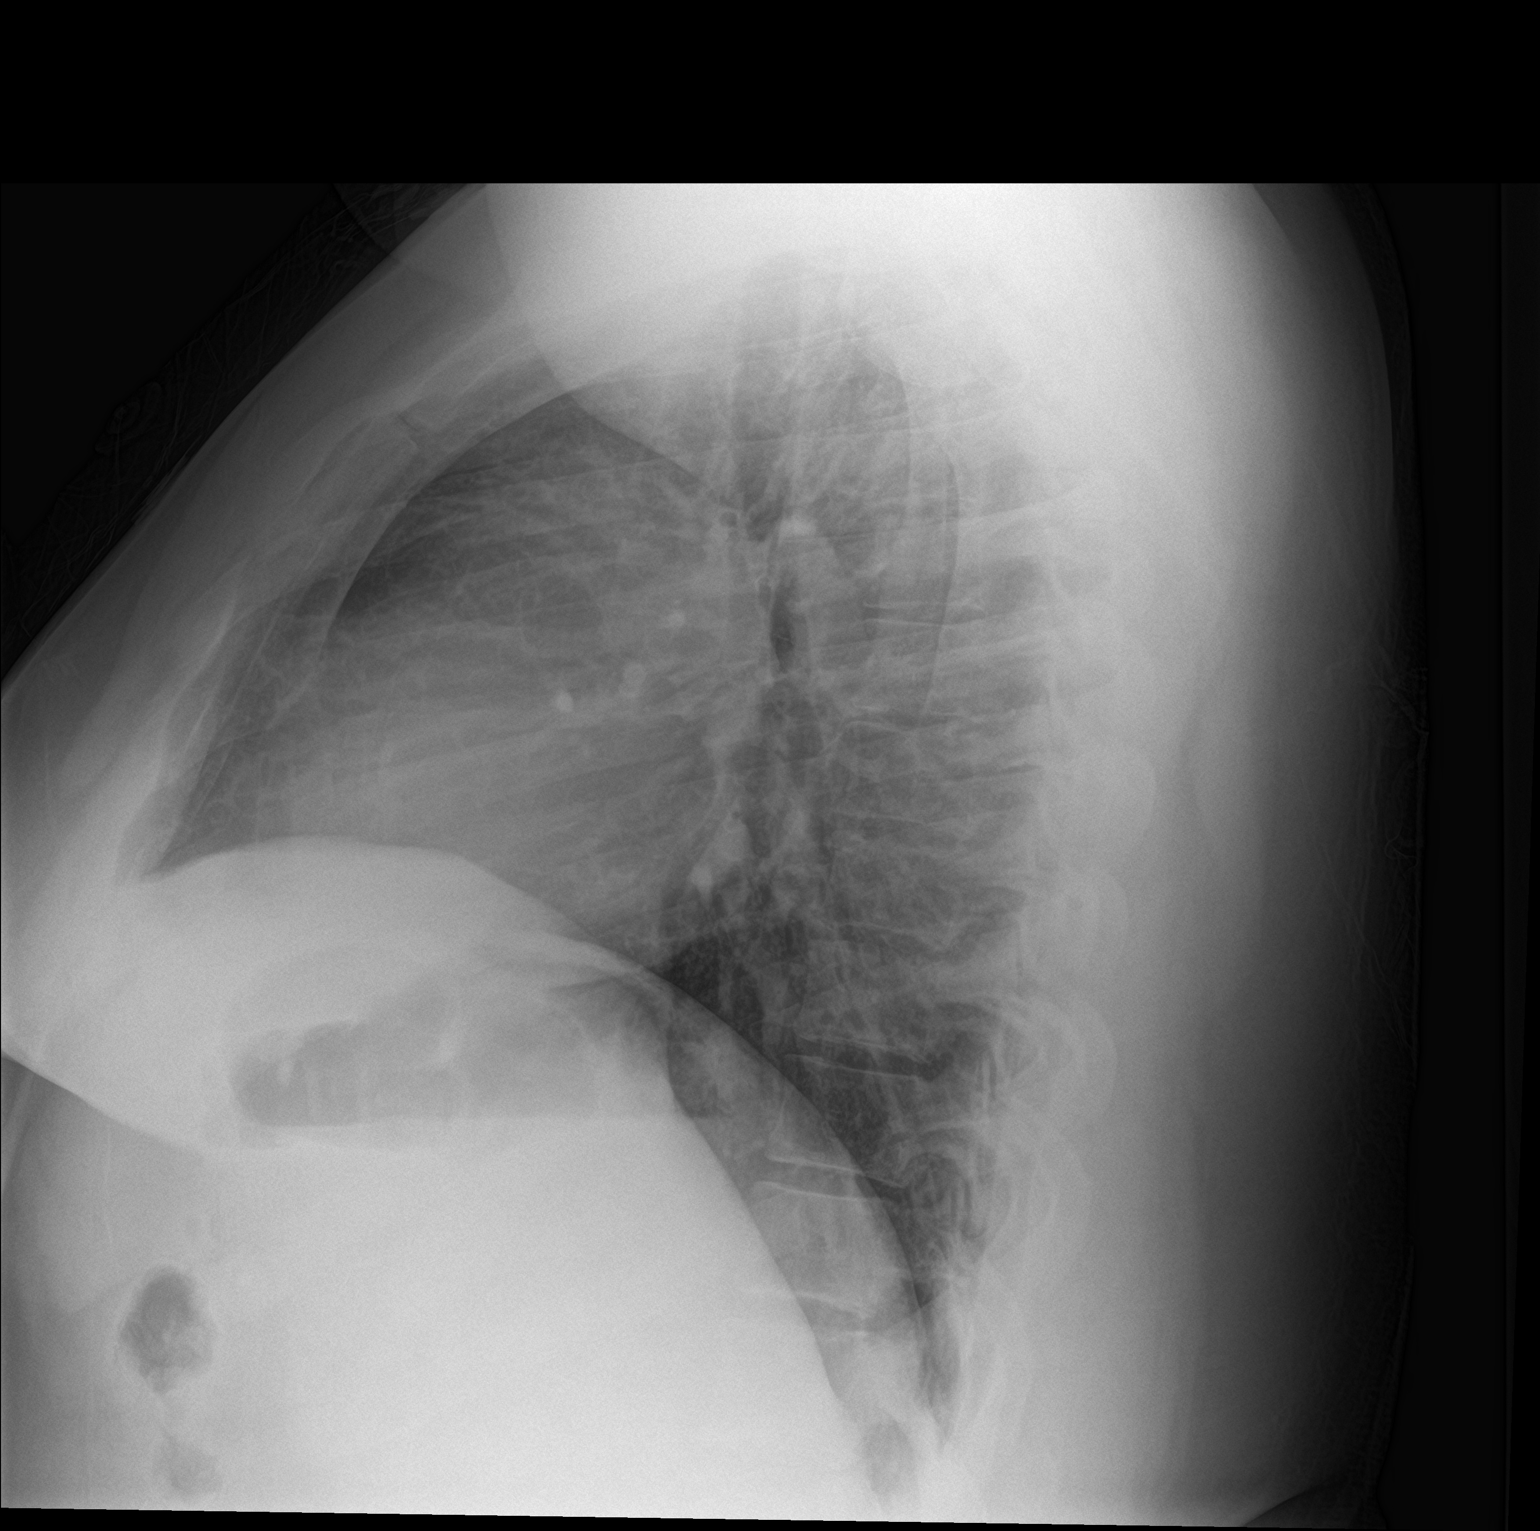

[2 of 2 positions shown; findings below may reference images not displayed]

FINDINGS: Lungs clear. Heart size normal. No pneumothorax or pleural fluid. No
bony abnormality.
IMPRESSION: Negative chest.

## 2021-11-03 ENCOUNTER — Encounter (INDEPENDENT_AMBULATORY_CARE_PROVIDER_SITE_OTHER): Payer: Self-pay

## 2022-01-13 NOTE — Discharge Summary (Signed)
 [facility]   Patient: Lori Boyd MRN: [id number]   Admission date: 01/12/2022 Discharge date : 01/13/2022 POD# 1. This is length of stay.     Assessment:  Morbid Obesity s/p bariatric surgery Principal Problem:   Morbid obesity (*) Active Problems:   Anxiety and depression   Hypercholesteremia   PAF (paroxysmal atrial fibrillation) (*)   Vitamin D  deficiency     Plan:  Patient is stable enough for discharge. Discharge instructions given and post operative instructions have been gone over with the patient with surgical nurse navigator. Follow up 1 week in office for staple removal.    Subjective:  Lori Boyd is a 31 y.o.female who is status post SG. No acute events overnight. Patient mentions no nausea and no vomiting. Patient is not complaining of abdominal pain.   Patient is tolerating clears. she has been up and ambulating without difficulty. she has received bariatric persistant nausea vomiting protocol medications in the past 12 hrs.      Objective:   Blood pressure (!) 152/89, pulse 50, temperature 98.4 F (36.9 C), temperature source Oral, resp. rate 18, height 6' (1.829 m), weight (!) 349 lb (158.3 kg), last menstrual period 12/13/2021, SpO2 96 %, not currently breastfeeding.  Intake/Output Summary (Last 24 hours) at 01/13/2022 0900 Last data filed at 01/13/2022 0535 Gross per 24 hour  Intake 2086.37 ml  Output --  Net 2086.37 ml   NAD. Abdomen: soft, nontender, nondistended, incisions with staples: clean dry intact, without drainage. Ext: BLE soft calf with TED/SCDs. Receiving subq anticoagulant.     Electronically signed: Debby JONELLE Ferrari, MD 01/13/2022 / 9:00 AM

## 2022-01-19 NOTE — Progress Notes (Signed)
        Patient presents for 1 week post op with Bariatric Nurse for follow up and staple removal  She is status post sleeve gastrectomy (laparoscopic) with Dr. Hope on 12/26/2021. Abdomen reveals 5 stapled incisions. All wounds are without redness, swelling or drainage and incisions closed. After cleansing the incision sites with a Betadine prep pad the staples were removed without difficulty and steri strips applied.   Face to face Instructions provided for post op dietary changes, medication changes, activity progression with weight restrictions, wound care, and signs and symptoms of infection. Patient given opportunity to have all questions/concerns addressed.  Patient instructed to call our office if any issues present at any time.  [phone].

## 2022-06-29 NOTE — Consults (Signed)
 New patient evaluation regarding guarding a reviewed current right breast infection.  She had bilateral nipple piercing in 2020 but subsequently have had them removed.  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  She is usually able to express but not at present.  On exam today her breast on the right has abscess involves mainly the medial aspect of the nipple.  There are no other abnormalities of the breast and her axilla is clinically negative.  I sterilely prepped the area anesthetized with lidocaine and then incised and drained the abscess.  She is placed on Septra for a week and wound instructions are given.  Follow back up with any further needs

## 2024-02-01 NOTE — Progress Notes (Signed)
 Subjective   HPI:  Patient is here for visit for follow up and treatment of chronic metabolic issues. She is  2 year status post  SG performed. Weight loss from time of surgery has been 101 lbs.  Past year's visits were reviewed with nutrition, as well any admissions or ER visits. We discussed her long term weight loss goals and the necessary components of hitting protein target every single day, regular weight training exercise, and taking nutritional supplements and their key roles in achieving sustainable success.  Recent labs were reviewed as below with special attention to chronic abnormalities.   Follow-Up Questions:  Comorbidity List After Surgery: Wn:Ipjazuzd No:Insulin  No:Non-Insulin  Wn:HZMI requiring medicine  Wn:Ybezmopepizfpj  Wn:Ybezmuzwdpnw Wn:Dozze Apnea   she denies any problems with:  nausea or vomiting no, melena or bloody stools no, diarrhea no, constipation no, dysphagia no, excessive weight loss no.  Total time spent with patient was 30 minutes, of which 30 minutes were spent counseling regarding above issues.   Please see changes in medications compared to preop.  She is/is not  taking below listed supplements as prescribed in the manual.   PPI- not taking  Citrucel- no  MVI -yes  Actigall- no  Pepto Bismol -no  Calcium Citrate -yes  Vitamin D  -yes   she  is not doing resistance training. We discussed short and long term importance of regular exercise to maximize the metabolic benefits of the operation and lifestyle changes, as well as to prevent or minimize any muscle wasting. In particular, we talked about at least twice a week performing a typically cardio oriented exercise program, while 3 days a week performing resistance training. We also discussed variations on above suggested schedule. Also gave she information about exercising at home while gyms are closed and / or weather is bad.  she is not  Complaining of  food noise, cravings, binge-eating in  the evening after her Vyvanse has worn off.  denies vomiting, abdominal pain, fussiness, diarrhea, cough, and difficulty breathing Latex allergy  Past Medical History:  Diagnosis Date  . Afib (*)    for one day in 2020; cause unknown  . Anxiety   . Depression   . Sprain of anterior talofibular ligament of left ankle 11/24/2016  . Subluxation of peroneal tendon of left foot 12/14/2016   Past Surgical History:  Procedure Laterality Date  . Ankle surgery  2019  . Laparoscopic sleeve gastrectomy   01/12/2022  . Nasal sinus surgery  2014  . Wisdom tooth removal     Medications Ordered Prior to Encounter[1] Allergies: Allergies[2] Social History[3] Family History  Problem Relation Age of Onset  . Atrial fibrillation Father   . Skin cancer Maternal Grandmother   . Prostate cancer Maternal Grandfather    ROS: Vitamin Deficiencies denies evidence of calcium deficiency such as altered mental status, tetanus, generalized weakness denies evidence of L- Carnitine deficiency such as lipid intolerance, encephalopathy denies evidence of cobalamin deficiency such as general weakness, anemia denies evidence of copper deficiency including fatigue, bleeding under the skin, anemia, cardiomegaly denies evidence of folate deficiency including generalized weakness, anemia GI discomfort denies evidence of iron deficiency including fatigue shortness of breath malaise denies evidence of thiamine deficiency such as numbness in the fingers or toes, neuropathy, irritation, encephalopathy denies evidence of vitamin A deficiency such as night blindness denies evidence of vitamin D  deficiency such as bone or joint pain, depression denies evidence of zinc deficiency such as skin disorders or hair loss  Objective  BP ROLLEN)  132/91 (BP Location: Left Upper Arm, Patient Position: Sitting)   Pulse 77   Ht 6' (1.829 m)   Wt 248 lb (112.5 kg)   SpO2 100%   BMI 33.63 kg/m  Chaperone is not present for  exam Physical Examination:  GENERAL ASSESSMENT: well developed and well nourished, obese SKIN: normal color, no lesions, non-icteric, no petechiae, no ecchymoses HEAD: normocephalic, atraumatic EYES: non-icteric sclerae EARS: normal external appearance NOSE: normal external appearance and nares patent MOUTH:  moist mucosa, no dry mucosa, no tongue coating, multiple teeth present and grossly normal NECK: normal supple full range of motion and no adenopathy or masses, trachea midline, no carotid bruits, no JVD CHEST: normal air exchange, no rales, no rhonchi, no wheezes, respiratory effort normal with no retractions HEART: regular rate and rhythm, normal S1/S2, no murmurs, no thrills, no rubs, no gallops ABDOMEN: soft, obese, non-distended, no masses, no hepatosplenomegaly, incisions are well healed with no herniation EXTREMITY: normal and symmetric movement, normal range of motion, no joint swelling NEURO: strength normal and symmetric, alert and oriented X 3, gait normal Psycho/social: normal affect and behavior  Lab on 01/26/2024  Component Date Value Ref Range Status  . WBC 01/26/2024 7.8  3.4 - 10.8 x10E3/uL Final  . RBC 01/26/2024 4.23  3.77 - 5.28 x10E6/uL Final  . Hemoglobin 01/26/2024 13.6  11.1 - 15.9 g/dL Final  . Hematocrit 89/68/7974 41.4  34.0 - 46.6 % Final  . MCV 01/26/2024 98 (H)  79 - 97 fL Final  . MCH 01/26/2024 32.2  26.6 - 33.0 pg Final  . MCHC 01/26/2024 32.9  31.5 - 35.7 g/dL Final  . RDW 89/68/7974 11.7  11.7 - 15.4 % Final  . Platelet Count 01/26/2024 343  150 - 450 x10E3/uL Final  . Neutrophils 01/26/2024 51  Not Estab. % Final  . Lymphs Relative 01/26/2024 40  Not Estab. % Final  . Monocytes 01/26/2024 7  Not Estab. % Final  . Eos Relative 01/26/2024 1  Not Estab. % Final  . Basos Relative  01/26/2024 1  Not Estab. % Final  . Neutrophils Absolute 01/26/2024 4.1  1.4 - 7.0 x10E3/uL Final  . Lymphocytes Absolute 01/26/2024 3.1  0.7 - 3.1 x10E3/uL Final  .  Monocytes Absolute 01/26/2024 0.5  0.1 - 0.9 x10E3/uL Final  . Eosinophils Absolute 01/26/2024 0.1  0.0 - 0.4 x10E3/uL Final  . Basophils Absolute 01/26/2024 0.1  0.0 - 0.2 x10E3/uL Final  . Immature Granulocytes 01/26/2024 0  Not Estab. % Final  . Immature Grans (Abs) 01/26/2024 0.0  0.0 - 0.1 x10E3/uL Final  . Glucose 01/26/2024 84  70 - 99 mg/dL Final  . BUN 89/68/7974 13  6 - 20 mg/dL Final  . Creatinine 89/68/7974 0.80  0.57 - 1.00 mg/dL Final  . eGFR 89/68/7974 102  >59 mL/min/1.73 Final  . BUN/Creatinine Ratio 01/26/2024 16  9 - 23 Final  . Sodium 01/26/2024 139  134 - 144 mmol/L Final  . Potassium 01/26/2024 3.9  3.5 - 5.2 mmol/L Final  . Chloride 01/26/2024 102  96 - 106 mmol/L Final  . CO2 01/26/2024 22  20 - 29 mmol/L Final  . CALCIUM 01/26/2024 9.8  8.7 - 10.2 mg/dL Final  . Total Protein 01/26/2024 6.9  6.0 - 8.5 g/dL Final  . Albumin, Serum 01/26/2024 4.7  4.0 - 5.0 g/dL Final  . Globulin, Total 01/26/2024 2.2  1.5 - 4.5 g/dL Final  . Total Bilirubin 01/26/2024 0.8  0.0 - 1.2 mg/dL Final  .  Alkaline Phosphatase 01/26/2024 71  41 - 116 IU/L Final  . AST 01/26/2024 22  0 - 40 IU/L Final  . ALT (SGPT) 01/26/2024 15  0 - 32 IU/L Final  . Ferritin 01/26/2024 51  15 - 150 ng/mL Final  . Prealbumin 01/26/2024 29  14 - 35 mg/dL Final  . Vit D, 74-Ybimnkb 01/26/2024 50.5  30.0 - 100.0 ng/mL Final  . Vitamin B-12 01/26/2024 444  232 - 1,245 pg/mL Final  . Folate 01/26/2024 16.8  >3.0 ng/mL Final   Assessment   1. History of sleeve gastrectomy   2. Postsurgical malabsorption (*)   3. Binge eating   sment Plan  Thyra Saddie Sandeen has done well post bariatric surgery. she is status post laparoscopic sleeve gastrectomy at [facility]. she has been compliant with our bariatric post operative requirements.  Current BMI is Body mass index is 33.63 kg/m..  she has had resolution or, at least, improvement of her obesity related co-morbidities.  Labs reviewed today  and results are unremarkable.  she has obesity-related medical problems including:  1. History of sleeve gastrectomy   2. Postsurgical malabsorption (*)   3. Binge eating    . Obesity: Start topiramate and take as directed for the treatment of obesity in combination with healthy lifestyle changes.  Discussed that this is an off label use of the medication.  Common side effects and concerns discussed with patient and informed consent signed  Exercise, including three times per week resistance training stressed along with at least twice per week of cardio related exercise. We discussed in detail the differences and advantages of each..  Protein intake of 80+ gms per day stressed. Offered if patient had any concerns or confusion that we arrange follow up appointment with nutritionist.  Follow up in 3 months.  Lifestyle and supplementation per AVS.   I have reviewed the discharge plans and course since discharge. After discussion, the patient understands these plans and questions related to the plans have been addressed       [1] Current Outpatient Medications on File Prior to Visit  Medication Sig Dispense Refill  . buPROPion hcl (WELLBUTRIN XL) 300 mg 24 hr tablet     . lisdexamfetamine (VYVANSE) 60 mg capsule     . Multiple Vitamin (MULTIVITAMIN ADULT PO) Take by mouth daily.    . vitamin D3, cholecalciferol, (OPTIMAL-D) 50,000 units CAPS Take one capsule by mouth once a week. 12 capsule 4   No current facility-administered medications on file prior to visit.  [2] No Known Allergies [3] Social History Socioeconomic History  . Marital status: Married  . Number of children: 0  Tobacco Use  . Smoking status: Never    Passive exposure: Never  . Smokeless tobacco: Never  Vaping Use  . Vaping status: Never Used  Substance and Sexual Activity  . Alcohol use: Yes    Alcohol/week: 10.0 - 18.0 standard drinks of alcohol    Types: 10 - 18 Standard drinks or equivalent per week  .  Drug use: Not Currently    Types: Marijuana  . Sexual activity: Yes    Partners: Female    Birth control/protection: None

## 2024-02-09 ENCOUNTER — Encounter (HOSPITAL_COMMUNITY): Payer: Self-pay

## 2024-02-09 ENCOUNTER — Observation Stay (HOSPITAL_COMMUNITY): Admitting: Anesthesiology

## 2024-02-09 ENCOUNTER — Observation Stay (HOSPITAL_COMMUNITY)
Admission: EM | Admit: 2024-02-09 | Discharge: 2024-02-10 | Disposition: A | Discharge status: Home / Self Care | Attending: General Surgery | Admitting: General Surgery

## 2024-02-09 ENCOUNTER — Encounter (HOSPITAL_COMMUNITY)
Admission: EM | Disposition: A | Discharge status: Home / Self Care | Payer: Self-pay | Source: Home / Self Care | Attending: Emergency Medicine

## 2024-02-09 ENCOUNTER — Other Ambulatory Visit: Payer: Self-pay

## 2024-02-09 ENCOUNTER — Emergency Department (HOSPITAL_COMMUNITY)

## 2024-02-09 DIAGNOSIS — F418 Other specified anxiety disorders: Secondary | ICD-10-CM | POA: Diagnosis not present

## 2024-02-09 DIAGNOSIS — F1090 Alcohol use, unspecified, uncomplicated: Secondary | ICD-10-CM | POA: Insufficient documentation

## 2024-02-09 DIAGNOSIS — F1290 Cannabis use, unspecified, uncomplicated: Secondary | ICD-10-CM | POA: Diagnosis not present

## 2024-02-09 DIAGNOSIS — K3580 Unspecified acute appendicitis: Principal | ICD-10-CM | POA: Diagnosis present

## 2024-02-09 DIAGNOSIS — Z87891 Personal history of nicotine dependence: Secondary | ICD-10-CM | POA: Insufficient documentation

## 2024-02-09 DIAGNOSIS — E785 Hyperlipidemia, unspecified: Secondary | ICD-10-CM | POA: Diagnosis not present

## 2024-02-09 DIAGNOSIS — I480 Paroxysmal atrial fibrillation: Secondary | ICD-10-CM | POA: Diagnosis not present

## 2024-02-09 DIAGNOSIS — K37 Unspecified appendicitis: Secondary | ICD-10-CM | POA: Diagnosis not present

## 2024-02-09 DIAGNOSIS — R1031 Right lower quadrant pain: Secondary | ICD-10-CM | POA: Diagnosis present

## 2024-02-09 HISTORY — PX: LAPAROSCOPIC APPENDECTOMY: SHX408

## 2024-02-09 LAB — CBC
HCT: 40.4 % (ref 36.0–46.0)
Hemoglobin: 13.7 g/dL (ref 12.0–15.0)
MCH: 31.9 pg (ref 26.0–34.0)
MCHC: 33.9 g/dL (ref 30.0–36.0)
MCV: 94.2 fL (ref 80.0–100.0)
Platelets: 300 K/uL (ref 150–400)
RBC: 4.29 MIL/uL (ref 3.87–5.11)
RDW: 12.3 % (ref 11.5–15.5)
WBC: 16 K/uL — ABNORMAL HIGH (ref 4.0–10.5)
nRBC: 0 % (ref 0.0–0.2)

## 2024-02-09 LAB — COMPREHENSIVE METABOLIC PANEL WITH GFR
ALT: 15 U/L (ref 0–44)
AST: 20 U/L (ref 15–41)
Albumin: 4.6 g/dL (ref 3.5–5.0)
Alkaline Phosphatase: 57 U/L (ref 38–126)
Anion gap: 13 (ref 5–15)
BUN: 11 mg/dL (ref 6–20)
CO2: 20 mmol/L — ABNORMAL LOW (ref 22–32)
Calcium: 9.3 mg/dL (ref 8.9–10.3)
Chloride: 105 mmol/L (ref 98–111)
Creatinine, Ser: 0.69 mg/dL (ref 0.44–1.00)
GFR, Estimated: >60 mL/min (ref >60)
Glucose, Bld: 106 mg/dL — ABNORMAL HIGH (ref 70–99)
Potassium: 3.6 mmol/L (ref 3.5–5.1)
Sodium: 138 mmol/L (ref 135–145)
Total Bilirubin: 0.8 mg/dL (ref 0.0–1.2)
Total Protein: 7 g/dL (ref 6.5–8.1)

## 2024-02-09 LAB — URINALYSIS, ROUTINE W REFLEX MICROSCOPIC
Bilirubin Urine: NEGATIVE
Glucose, UA: NEGATIVE mg/dL
Hgb urine dipstick: NEGATIVE
Ketones, ur: 20 mg/dL — AB
Nitrite: NEGATIVE
Protein, ur: NEGATIVE mg/dL
Specific Gravity, Urine: 1.025 (ref 1.005–1.030)
pH: 5 (ref 5.0–8.0)

## 2024-02-09 LAB — HCG, SERUM, QUALITATIVE: Preg, Serum: NEGATIVE

## 2024-02-09 LAB — LIPASE, BLOOD: Lipase: 23 U/L (ref 11–51)

## 2024-02-09 LAB — SURGICAL PCR SCREEN
MRSA, PCR: NEGATIVE
Staphylococcus aureus: NEGATIVE

## 2024-02-09 SURGERY — APPENDECTOMY, LAPAROSCOPIC
Anesthesia: General | Site: Abdomen

## 2024-02-09 MED ORDER — HYDROMORPHONE HCL 2 MG/ML IJ SOLN
INTRAMUSCULAR | Status: AC
  Filled 2024-02-09: qty 1

## 2024-02-09 MED ORDER — SODIUM CHLORIDE 0.9 % IV BOLUS
500.0000 mL | Freq: Once | INTRAVENOUS | Status: AC
  Administered 2024-02-09: 500 mL via INTRAVENOUS

## 2024-02-09 MED ORDER — PROPOFOL 10 MG/ML IV BOLUS
INTRAVENOUS | Status: AC
Start: 2024-02-09 — End: 2024-02-09
  Filled 2024-02-09: qty 20

## 2024-02-09 MED ORDER — LIDOCAINE 2% (20 MG/ML) 5 ML SYRINGE
INTRAMUSCULAR | Status: DC | PRN
Start: 2024-02-09 — End: 2024-02-09
  Administered 2024-02-09: 20 mg via INTRAVENOUS

## 2024-02-09 MED ORDER — LACTATED RINGERS IR SOLN
Status: DC | PRN
  Administered 2024-02-09: 1000 mL

## 2024-02-09 MED ORDER — SODIUM CHLORIDE 0.9 % IV SOLN
2.0000 g | INTRAVENOUS | Status: DC
  Administered 2024-02-09: 2 g via INTRAVENOUS
  Filled 2024-02-09: qty 20

## 2024-02-09 MED ORDER — OXYCODONE HCL 5 MG PO TABS
5.0000 mg | ORAL_TABLET | ORAL | Status: DC | PRN
  Administered 2024-02-09 – 2024-02-10 (×2): 5 mg via ORAL
  Filled 2024-02-09 (×2): qty 1

## 2024-02-09 MED ORDER — ENOXAPARIN SODIUM 40 MG/0.4ML IJ SOSY: 40.0000 mg | PREFILLED_SYRINGE | INTRAMUSCULAR | Status: DC

## 2024-02-09 MED ORDER — MIDAZOLAM HCL (PF) 2 MG/2ML IJ SOLN
INTRAMUSCULAR | Status: DC | PRN
  Administered 2024-02-09: 2 mg via INTRAVENOUS

## 2024-02-09 MED ORDER — POLYETHYLENE GLYCOL 3350 17 G PO PACK: 17.0000 g | PACK | Freq: Every day | ORAL | Status: DC | PRN

## 2024-02-09 MED ORDER — ROCURONIUM BROMIDE 10 MG/ML (PF) SYRINGE
PREFILLED_SYRINGE | INTRAVENOUS | Status: DC | PRN
  Administered 2024-02-09: 20 mg via INTRAVENOUS
  Administered 2024-02-09: 40 mg via INTRAVENOUS

## 2024-02-09 MED ORDER — BUPIVACAINE-EPINEPHRINE 0.5% -1:200000 IJ SOLN
INTRAMUSCULAR | Status: DC | PRN
  Administered 2024-02-09: 20 mL

## 2024-02-09 MED ORDER — MORPHINE SULFATE (PF) 4 MG/ML IV SOLN
4.0000 mg | Freq: Once | INTRAVENOUS | Status: AC
  Administered 2024-02-09: 4 mg via INTRAVENOUS
  Filled 2024-02-09: qty 1

## 2024-02-09 MED ORDER — CEFAZOLIN SODIUM-DEXTROSE 2-3 GM-%(50ML) IV SOLR
INTRAVENOUS | Status: DC | PRN
  Administered 2024-02-09: 2 g via INTRAVENOUS

## 2024-02-09 MED ORDER — LACTATED RINGERS IV SOLN
INTRAVENOUS | Status: DC | PRN
Start: 2024-02-09 — End: 2024-02-09

## 2024-02-09 MED ORDER — METHOCARBAMOL 1000 MG/10ML IJ SOLN: 500.0000 mg | Freq: Three times a day (TID) | INTRAMUSCULAR | Status: DC | PRN

## 2024-02-09 MED ORDER — PROPRANOLOL HCL 20 MG PO TABS: 80.0000 mg | ORAL_TABLET | Freq: Every day | ORAL | Status: DC

## 2024-02-09 MED ORDER — HYDRALAZINE HCL 20 MG/ML IJ SOLN: 10.0000 mg | INTRAMUSCULAR | Status: DC | PRN

## 2024-02-09 MED ORDER — IOHEXOL 300 MG/ML  SOLN
100.0000 mL | Freq: Once | INTRAMUSCULAR | Status: AC | PRN
  Administered 2024-02-09: 100 mL via INTRAVENOUS

## 2024-02-09 MED ORDER — SODIUM CHLORIDE 0.9 % IV SOLN
2.0000 g | Freq: Once | INTRAVENOUS | Status: AC
  Administered 2024-02-09: 2 g via INTRAVENOUS
  Filled 2024-02-09: qty 20

## 2024-02-09 MED ORDER — OXYCODONE HCL 5 MG/5ML PO SOLN: 5.0000 mg | Freq: Once | ORAL | Status: DC | PRN

## 2024-02-09 MED ORDER — DIPHENHYDRAMINE HCL 25 MG PO CAPS: 25.0000 mg | ORAL_CAPSULE | Freq: Four times a day (QID) | ORAL | Status: DC | PRN

## 2024-02-09 MED ORDER — KCL IN DEXTROSE-NACL 20-5-0.45 MEQ/L-%-% IV SOLN
INTRAVENOUS | Status: DC
  Filled 2024-02-09: qty 1000

## 2024-02-09 MED ORDER — MELATONIN 3 MG PO TABS: 3.0000 mg | ORAL_TABLET | Freq: Every evening | ORAL | Status: DC | PRN

## 2024-02-09 MED ORDER — SIMETHICONE 80 MG PO CHEW: 40.0000 mg | CHEWABLE_TABLET | Freq: Four times a day (QID) | ORAL | Status: DC | PRN

## 2024-02-09 MED ORDER — OXYCODONE HCL 5 MG PO TABS: 5.0000 mg | ORAL_TABLET | Freq: Once | ORAL | Status: DC | PRN

## 2024-02-09 MED ORDER — ACETAMINOPHEN 500 MG PO TABS
1000.0000 mg | ORAL_TABLET | Freq: Four times a day (QID) | ORAL | Status: DC
  Administered 2024-02-09 – 2024-02-10 (×2): 1000 mg via ORAL
  Filled 2024-02-09 (×2): qty 2

## 2024-02-09 MED ORDER — METOPROLOL TARTRATE 5 MG/5ML IV SOLN: 5.0000 mg | Freq: Four times a day (QID) | INTRAVENOUS | Status: DC | PRN

## 2024-02-09 MED ORDER — MIDAZOLAM HCL 2 MG/2ML IJ SOLN
INTRAMUSCULAR | Status: AC
  Filled 2024-02-09: qty 2

## 2024-02-09 MED ORDER — HYDROMORPHONE HCL 1 MG/ML IJ SOLN: 0.2500 mg | INTRAMUSCULAR | Status: DC | PRN

## 2024-02-09 MED ORDER — ONDANSETRON HCL 4 MG/2ML IJ SOLN
INTRAMUSCULAR | Status: DC | PRN
  Administered 2024-02-09: 4 mg via INTRAVENOUS

## 2024-02-09 MED ORDER — ALPRAZOLAM 0.25 MG PO TABS: 0.2500 mg | ORAL_TABLET | Freq: Two times a day (BID) | ORAL | Status: DC | PRN

## 2024-02-09 MED ORDER — ONDANSETRON HCL 4 MG/2ML IJ SOLN
4.0000 mg | Freq: Once | INTRAMUSCULAR | Status: AC
Start: 2024-02-09 — End: 2024-02-09
  Administered 2024-02-09: 4 mg via INTRAVENOUS
  Filled 2024-02-09: qty 2

## 2024-02-09 MED ORDER — DEXAMETHASONE SOD PHOSPHATE PF 10 MG/ML IJ SOLN
INTRAMUSCULAR | Status: DC | PRN
  Administered 2024-02-09: 10 mg via INTRAVENOUS

## 2024-02-09 MED ORDER — ONDANSETRON 4 MG PO TBDP: 4.0000 mg | ORAL_TABLET | Freq: Four times a day (QID) | ORAL | Status: DC | PRN

## 2024-02-09 MED ORDER — FENTANYL CITRATE (PF) 100 MCG/2ML IJ SOLN
INTRAMUSCULAR | Status: AC
  Filled 2024-02-09: qty 2

## 2024-02-09 MED ORDER — PROPOFOL 10 MG/ML IV BOLUS
INTRAVENOUS | Status: DC | PRN
  Administered 2024-02-09: 270 mg via INTRAVENOUS

## 2024-02-09 MED ORDER — SUCCINYLCHOLINE CHLORIDE 200 MG/10ML IV SOSY
PREFILLED_SYRINGE | INTRAVENOUS | Status: DC | PRN
  Administered 2024-02-09: 200 mg via INTRAVENOUS

## 2024-02-09 MED ORDER — 0.9 % SODIUM CHLORIDE (POUR BTL) OPTIME
TOPICAL | Status: DC | PRN
  Administered 2024-02-09: 1000 mL

## 2024-02-09 MED ORDER — HYDROMORPHONE HCL 1 MG/ML IJ SOLN
INTRAMUSCULAR | Status: DC | PRN
  Administered 2024-02-09: 1 mg via INTRAVENOUS

## 2024-02-09 MED ORDER — ONDANSETRON HCL 4 MG/2ML IJ SOLN: 4.0000 mg | Freq: Four times a day (QID) | INTRAMUSCULAR | Status: DC | PRN

## 2024-02-09 MED ORDER — PROPOFOL 10 MG/ML IV BOLUS
INTRAVENOUS | Status: AC
  Filled 2024-02-09: qty 20

## 2024-02-09 MED ORDER — MORPHINE SULFATE (PF) 2 MG/ML IV SOLN
1.0000 mg | INTRAVENOUS | Status: DC | PRN
  Administered 2024-02-09: 2 mg via INTRAVENOUS
  Filled 2024-02-09: qty 1

## 2024-02-09 MED ORDER — METRONIDAZOLE 500 MG/100ML IV SOLN
500.0000 mg | Freq: Once | INTRAVENOUS | Status: AC
  Administered 2024-02-09: 500 mg via INTRAVENOUS
  Filled 2024-02-09: qty 100

## 2024-02-09 MED ORDER — METRONIDAZOLE 500 MG/100ML IV SOLN
500.0000 mg | Freq: Two times a day (BID) | INTRAVENOUS | Status: DC
  Administered 2024-02-09: 500 mg via INTRAVENOUS
  Filled 2024-02-09: qty 100

## 2024-02-09 MED ORDER — MIDAZOLAM HCL (PF) 2 MG/2ML IJ SOLN: 0.5000 mg | Freq: Once | INTRAMUSCULAR | Status: DC | PRN

## 2024-02-09 MED ORDER — METHOCARBAMOL 500 MG PO TABS: 500.0000 mg | ORAL_TABLET | Freq: Three times a day (TID) | ORAL | Status: DC | PRN

## 2024-02-09 MED ORDER — SUGAMMADEX SODIUM 200 MG/2ML IV SOLN
INTRAVENOUS | Status: DC | PRN
Start: 2024-02-09 — End: 2024-02-09
  Administered 2024-02-09: 400 mg via INTRAVENOUS

## 2024-02-09 MED ORDER — DIPHENHYDRAMINE HCL 50 MG/ML IJ SOLN: 25.0000 mg | Freq: Four times a day (QID) | INTRAMUSCULAR | Status: DC | PRN

## 2024-02-09 MED ORDER — FENTANYL CITRATE (PF) 250 MCG/5ML IJ SOLN
INTRAMUSCULAR | Status: DC | PRN
  Administered 2024-02-09 (×2): 50 ug via INTRAVENOUS

## 2024-02-09 MED ORDER — KCL IN DEXTROSE-NACL 20-5-0.45 MEQ/L-%-% IV SOLN
INTRAVENOUS | Status: DC
  Filled 2024-02-09 (×2): qty 1000

## 2024-02-09 MED ORDER — ONDANSETRON HCL 4 MG/2ML IJ SOLN
4.0000 mg | Freq: Once | INTRAMUSCULAR | Status: AC
  Administered 2024-02-09: 4 mg via INTRAVENOUS
  Filled 2024-02-09: qty 2

## 2024-02-09 SURGICAL SUPPLY — 33 items
BAG COUNTER SPONGE SURGICOUNT (BAG) IMPLANT
CHLORAPREP W/TINT 26 (MISCELLANEOUS) ×1 IMPLANT
CLIP APPLIE 5 13 M/L LIGAMAX5 (MISCELLANEOUS) IMPLANT
CLIP APPLIE ROT 10 11.4 M/L (STAPLE) IMPLANT
DERMABOND ADVANCED .7 DNX12 (GAUZE/BANDAGES/DRESSINGS) ×1 IMPLANT
ELECT REM PT RETURN 15FT ADLT (MISCELLANEOUS) ×1 IMPLANT
ENDOLOOP SUT PDS II 0 18 (SUTURE) IMPLANT
GLOVE BIO SURGEON STRL SZ 6.5 (GLOVE) ×1 IMPLANT
GLOVE INDICATOR 6.5 STRL GRN (GLOVE) ×1 IMPLANT
GOWN STRL REUS W/ TWL XL LVL3 (GOWN DISPOSABLE) ×1 IMPLANT
GRASPER SUT TROCAR 14GX15 (MISCELLANEOUS) IMPLANT
IRRIGATION SUCT STRKRFLW 2 WTP (MISCELLANEOUS) ×1 IMPLANT
KIT BASIN OR (CUSTOM PROCEDURE TRAY) ×1 IMPLANT
KIT TURNOVER KIT A (KITS) ×1 IMPLANT
NDL INSUFFLATION 14GA 120MM (NEEDLE) IMPLANT
NEEDLE INSUFFLATION 14GA 120MM (NEEDLE) ×1 IMPLANT
PENCIL SMOKE EVACUATOR (MISCELLANEOUS) IMPLANT
RELOAD STAPLE 60 3.6 BLU REG (STAPLE) IMPLANT
SCISSORS LAP 5X35 DISP (ENDOMECHANICALS) IMPLANT
SET TUBE SMOKE EVAC HIGH FLOW (TUBING) ×1 IMPLANT
SHEARS HARMONIC 36 ACE (MISCELLANEOUS) IMPLANT
SLEEVE ADV FIXATION 5X100MM (TROCAR) ×1 IMPLANT
SPIKE FLUID TRANSFER (MISCELLANEOUS) ×1 IMPLANT
STAPLE ECHEON FLEX 60 POW ENDO (STAPLE) ×1 IMPLANT
SUT VIC AB 2-0 SH 27X BRD (SUTURE) IMPLANT
SUT VIC AB 4-0 PS2 27 (SUTURE) ×1 IMPLANT
SUT VICRYL 0 UR6 27IN ABS (SUTURE) ×1 IMPLANT
SYSTEM BAG RETRIEVAL 10MM (BASKET) ×1 IMPLANT
TOWEL OR DSP ST BLU DLX 10/PK (DISPOSABLE) ×1 IMPLANT
TRAY FOLEY MTR SLVR 16FR STAT (SET/KITS/TRAYS/PACK) ×1 IMPLANT
TRAY LAPAROSCOPIC (CUSTOM PROCEDURE TRAY) ×1 IMPLANT
TROCAR ADV FIXATION 5X100MM (TROCAR) ×1 IMPLANT
TROCAR BALLN 12MMX100 BLUNT (TROCAR) ×1 IMPLANT

## 2024-02-09 NOTE — Discharge Instructions (Signed)

## 2024-02-09 NOTE — Op Note (Signed)
 Lori Boyd [id number]   PRE-OPERATIVE DIAGNOSIS:  APPENDICITIS  POST-OPERATIVE DIAGNOSIS:  APPENDICITIS   Procedure(s): LAPAROSCOPIC APPENDECTOMY  Surgeon(s): Debby Hila, MD  ASSISTANT: none   ANESTHESIA:   local and general  EBL:   5ml  Delay start of Pharmacological VTE agent (>24hrs) due to surgical blood loss or risk of bleeding:  no  DRAINS: none   SPECIMEN:  Source of Specimen:  appendix  DISPOSITION OF SPECIMEN:  PATHOLOGY  COUNTS:  YES  PLAN OF CARE: Admit for overnight observation  PATIENT DISPOSITION:  PACU - hemodynamically stable.   INDICATIONS: Patient with concerning symptoms & work up suspicious for appendicitis.  Surgery was recommended:  The anatomy & physiology of the digestive tract was discussed.  The pathophysiology of appendicitis was discussed.  Natural history risks without surgery was discussed.   I feel the risks of no intervention will lead to serious problems that outweigh the operative risks; therefore, I recommended diagnostic laparoscopy with removal of appendix to remove the pathology.  Laparoscopic & open techniques were discussed.   I noted a good likelihood this will help address the problem.    Risks such as bleeding, infection, abscess, leak, reoperation, possible ostomy, hernia, heart attack, death, and other risks were discussed.  Goals of post-operative recovery were discussed as well.  We will work to minimize complications.  Questions were answered.  The patient expresses understanding & wishes to proceed with surgery.  OR FINDINGS: appendicitis   DESCRIPTION:   The patient was identified & brought into the operating room. The patient was positioned supine with left arm tucked. SCDs were active during the entire case. The patient underwent general anesthesia without any difficulty.  A foley catheter was inserted under sterile conditions. The abdomen was prepped and draped in a sterile fashion. A Surgical Timeout confirmed  our plan.   I made a transverse incision through the inferior umbilical fold.  I made a nick in the infraumbilical fascia and confirmed peritoneal entry.  I placed a stay suture and then the [facility] port.  We induced carbon dioxide insufflation.  Camera inspection revealed no injury.  I placed additional ports under direct laparoscopic visualization.  I mobilized the terminal ileum to proximal ascending colon in a lateral to medial fashion.  I took care to avoid injuring any retroperitoneal structures.  I freed the appendix off its attachments to the ascending colon and cecal mesentery.  I elevated the appendix.  I was able to free off the base of the appendix, which was still viable.  I stapled the appendix off the cecum using a laparoscopic blue load stapler.  I took a healthy cuff viable cecum. I skeletonized & ligated the mesoappendix with a harmonic scalpel.  I placed the appendix inside an EndoCatch bag and removed out the [place] port.  I did copious irrigation. Hemostasis was good in the mesoappendix, colon mesentery, and retroperitoneum. Staple line was intact on the cecum with no bleeding. I washed out the pelvis, retrohepatic space and right paracolic gutter.  Hemostasis is good. There was no perforation or injury.  Because the area cleaned up well after irrigation, I did not place a drain.  I aspirated the carbon dioxide. I removed the ports. I closed the umbilical fascia site using a 3, 0 Vicryl stitches with a PMI laparoscopic suture passer. I closed the umbilical subcutaneous tissue using a 2-0 Vicryl suture and the skin using 4-0 vicryl stitch.  Sterile dressings were applied.  Patient was extubated and sent to the  recovery room.  I discussed the operative findings with the patient's family. I suspect the patient is going used in the hospital at least overnight and will need antibiotics for 1 dose. Questions answered. They expressed understanding and appreciation.   Lori JAYSON Ned,  MD  Colorectal and General Surgery [facility]

## 2024-02-09 NOTE — H&P (Signed)
 Lori Boyd 20-Sep-1990  [id number].    Requesting MD: Dr. Ozell Arts Chief Complaint/Reason for Consult: Acute Appendicitis  HPI: Lori Boyd is a 33 y.o. female with history of A-fib (7year ago due to medication reaction) not currently on any anticoagulation, HLD and prior sleeve gastrectomy at [facility] in 2023 who presented to the ED for abdominal pain.  Patient reports RLQ abdominal pain with associated chills, nausea. No fever, urinary symptoms or diarrhea. Having flatulence. Her workup was concerning for acute appendicitis. She is HDS without fever, tachycardia or hypotension (on propanolol at baseline). WBC 16. General surgery was asked to see.   Surgical history: Sleeve gastrectomy in 2023, Left ankle surgery, Nasal surgery Allergies: NKDA Tobacco use: Denies Illicit drug use: THC rarley (maybe once per month) Alcohol use: 4 days per week she has 1 bottle of wine  ROS: ROS As above, see hpi  Family History  Problem Relation Age of Onset   Healthy Mother    Thyroid  disease Mother    Obesity Mother    Atrial fibrillation Father    Migraines Father    Migraines Sister     Past Medical History:  Diagnosis Date   Anticoagulation adequate 10/04/2018   Anxiety    Atrial fibrillation (HCC) 10/03/2018   Depression    HLD (hyperlipidemia) 10/04/2018   Left ankle pain    Migraine    Morbid obesity (HCC) 10/04/2018   Multifocal choroiditis    Recurrent sinus infections     Past Surgical History:  Procedure Laterality Date   left ankle reconstruction Left 01/03/2017   Dr. Harden   NASAL SINUS SURGERY     WISDOM TOOTH EXTRACTION      Social History:  reports that she has quit smoking. She has never used smokeless tobacco. She reports current alcohol use. She reports current drug use. Drug: Marijuana.  Allergies: No Known Allergies  (Not in a hospital admission)    Physical Exam: Blood pressure (!) 133/91, pulse (!) 52, temperature 97.9 F (36.6 C),  temperature source Oral, resp. rate 16, SpO2 100%. General: Pleasant female who is laying in bed in NAD. HEENT: Head is normocephalic, atraumatic.  Sclera are non-icteric. Heart: HR normal. Lungs: Respiratory effort nonlabored. Abd: Soft, ND, RLQ ttp. +BS. MS: Able to move all extremities. Skin: Warm and dry. Psych: A&Ox4 with an appropriate affect Neuro: normal speech, thought process intact, gait not assessed.   Results for orders placed or performed during the hospital encounter of 02/09/24 (from the past 48 hours)  Urinalysis, Routine w reflex microscopic -Urine, Clean Catch     Status: Abnormal   Collection Time: 02/09/24 10:09 AM  Result Value Ref Range   Color, Urine YELLOW YELLOW   APPearance HAZY (A) CLEAR   Specific Gravity, Urine 1.025 1.005 - 1.030   pH 5.0 5.0 - 8.0   Glucose, UA NEGATIVE NEGATIVE mg/dL   Hgb urine dipstick NEGATIVE NEGATIVE   Bilirubin Urine NEGATIVE NEGATIVE   Ketones, ur 20 (A) NEGATIVE mg/dL   Protein, ur NEGATIVE NEGATIVE mg/dL   Nitrite NEGATIVE NEGATIVE   Leukocytes,Ua MODERATE (A) NEGATIVE   RBC / HPF 0-5 0 - 5 RBC/hpf   WBC, UA 0-5 0 - 5 WBC/hpf   Bacteria, UA RARE (A) NONE SEEN   Squamous Epithelial / HPF 11-20 0 - 5 /HPF   Mucus PRESENT    Hyaline Casts, UA PRESENT     Comment: Performed at [facility], [address] [place], [place]  [address]  Lipase, blood     Status: None   Collection Time: 02/09/24 10:20 AM  Result Value Ref Range   Lipase 23 11 - 51 U/L    Comment: Performed at [facility], [address] [place], [place] [address]  Comprehensive metabolic panel     Status: Abnormal   Collection Time: 02/09/24 10:20 AM  Result Value Ref Range   Sodium 138 135 - 145 mmol/L   Potassium 3.6 3.5 - 5.1 mmol/L   Chloride 105 98 - 111 mmol/L   CO2 20 (L) 22 - 32 mmol/L   Glucose, Bld 106 (H) 70 - 99 mg/dL    Comment: Glucose reference range applies only to samples taken after fasting for  at least 8 hours.   BUN 11 6 - 20 mg/dL   Creatinine, Ser 9.30 0.44 - 1.00 mg/dL   Calcium 9.3 8.9 - 89.6 mg/dL   Total Protein 7.0 6.5 - 8.1 g/dL   Albumin 4.6 3.5 - 5.0 g/dL   AST 20 15 - 41 U/L   ALT 15 0 - 44 U/L   Alkaline Phosphatase 57 38 - 126 U/L   Total Bilirubin 0.8 0.0 - 1.2 mg/dL   GFR, Estimated >39 >39 mL/min    Comment: (NOTE) Calculated using the CKD-EPI Creatinine Equation (2021)    Anion gap 13 5 - 15    Comment: Performed at [facility], [address] [place], [place] [address]  CBC     Status: Abnormal   Collection Time: 02/09/24 10:20 AM  Result Value Ref Range   WBC 16.0 (H) 4.0 - 10.5 K/uL   RBC 4.29 3.87 - 5.11 MIL/uL   Hemoglobin 13.7 12.0 - 15.0 g/dL   HCT 59.5 63.9 - 53.9 %   MCV 94.2 80.0 - 100.0 fL   MCH 31.9 26.0 - 34.0 pg   MCHC 33.9 30.0 - 36.0 g/dL   RDW 87.6 88.4 - 84.4 %   Platelets 300 150 - 400 K/uL   nRBC 0.0 0.0 - 0.2 %    Comment: Performed at [facility], [address] [place], [place] [address]  hCG, serum, qualitative     Status: None   Collection Time: 02/09/24 10:20 AM  Result Value Ref Range   Preg, Serum NEGATIVE NEGATIVE    Comment:        THE SENSITIVITY OF THIS METHODOLOGY IS >10 mIU/mL. Performed at [facility], [address] [place], [place] [address]    CT ABDOMEN PELVIS W CONTRAST Result Date: 02/09/2024 CLINICAL DATA:  Abdominal pain, acute, nonlocalized EXAM: CT ABDOMEN AND PELVIS WITH CONTRAST TECHNIQUE: Multidetector CT imaging of the abdomen and pelvis was performed using the standard protocol following bolus administration of intravenous contrast. RADIATION DOSE REDUCTION: This exam was performed according to the departmental dose-optimization program which includes automated exposure control, adjustment of the mA and/or kV according to patient size and/or use of iterative reconstruction technique. CONTRAST:  100mL OMNIPAQUE IOHEXOL 300 MG/ML  SOLN  COMPARISON:  None Available. FINDINGS: Lower chest: Clear lung bases. No significant pleural or pericardial effusion. Hepatobiliary: The liver is normal in density without suspicious focal abnormality. No evidence of gallstones, gallbladder wall thickening or biliary dilatation. Pancreas: Unremarkable. No pancreatic ductal dilatation or surrounding inflammatory changes. Spleen: Normal in size without focal abnormality. Adrenals/Urinary Tract: Both adrenal glands appear normal. No evidence of urinary tract calculus, suspicious renal lesion or hydronephrosis. The bladder appears unremarkable for its degree  of distention. Stomach/Bowel: No enteric contrast administered. There are postsurgical changes within the stomach consistent with prior bariatric surgery. No small bowel distension, wall thickening or surrounding inflammation. The cecum is located in the midline pelvis with the appendix anterior to the sacrum. The appendix is distended and filled with multiple small calcified appendicoliths. The appendix measures up to 1.2 cm in diameter and demonstrates mild surrounding inflammation, consistent with acute appendicitis. No evidence of perforation or abscess. Mildly prominent proximal colonic stool burden. Vascular/Lymphatic: There are no enlarged abdominal or pelvic lymph nodes. No significant vascular findings. Reproductive: The uterus and ovaries appear unremarkable. No suspicious adnexal findings. Other: No evidence of abdominal wall mass or hernia. No ascites or pneumoperitoneum. Musculoskeletal: No acute or significant osseous findings. IMPRESSION: 1. Acute appendicitis without evidence of perforation or abscess. 2. No other acute findings. 3. 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 Electronically Signed   By: Elsie Perone M.D.   On: 02/09/2024 12:55    Anti-infectives (From admission, onward)    Start     Dose/Rate Route Frequency Ordered Stop   02/09/24 1315  cefTRIAXone  (ROCEPHIN) 2 g in sodium chloride  0.9 % 100 mL IVPB       Placed in And Linked Group   2 g 200 mL/hr over 30 Minutes Intravenous  Once 02/09/24 1304     02/09/24 1315  metroNIDAZOLE (FLAGYL) IVPB 500 mg       Placed in And Linked Group   500 mg 100 mL/hr over 60 Minutes Intravenous  Once 02/09/24 1304         Assessment/Plan Acute Appendicitis History, exam and imaging consistent with acute appendicitis. No evidence of perforation or abscess on CT. She does have appendicoliths on CT. Discussed options of treatment. I have explained the procedure, risks, and aftercare of laparoscopic appendectomy.  Risks include but are not limited to anesthesia (MI, CVA, death, aspiration, prolonged intubation), bleeding, infection, wound problems, hernia, injury to surrounding structures (viscus, nerves, blood vessels, ureter), need for conversion to open procedure or ileocecectomy, post operative ileus or abscess, stump leak, stump appendicitis and increased risk of DVT/PE. She seems to understand and agrees to proceed with surgery. Keep NPO. IV abx. Pending scheduling will move forward with surgery.  FEN - NPO VTE - SCDs ID - Rocephin/Flagyl Foley - None currently  I reviewed EDP incomplete note, nursing notes, last 24 h vitals and pain scores, last 48 h intake and output, last 24 h labs and trends, and last 24 h imaging results.  This care required high  level of medical decision making.   Marjorie Favre, [facility] 02/09/2024, 1:47 PM Please see Amion for pager number during day hours 7:00am-4:30pm

## 2024-02-09 NOTE — ED Triage Notes (Signed)
 C/o diffuse midabdominal pain starting 0400 which has been persistent. Some nausea. Last BM this morning.

## 2024-02-09 NOTE — ED Provider Notes (Signed)
 [place] EMERGENCY DEPARTMENT AT [facility] Provider Note   CSN: [id number] Arrival date & time: 02/09/24  9040     Patient presents with: Abdominal Pain   Lori Boyd is a 33 y.o. female.  He is here with a complaint of mid abdominal pain that started around 4 AM this morning.  Associated with nausea and vomiting.  No fever but has had some cold chills.  No urinary symptoms.  She said for about a week her stomach is felt a little off and she has noticed a little bit of blood in her stool.  No diarrhea.  Prior history of gastric sleeve.  No sick contacts or recent travel.  Has tried nothing for her symptoms.  Rates it as 7 out of 10 pain   The history is provided by the patient.  Abdominal Pain Pain location:  Epigastric Pain quality: aching   Pain radiates to:  Does not radiate Pain severity:  Moderate Onset quality:  Sudden Duration:  7 hours Timing:  Constant Progression:  Unchanged Chronicity:  New Context: not trauma   Relieved by:  None tried Worsened by:  Movement Ineffective treatments:  None tried Associated symptoms: chills, nausea and vomiting   Associated symptoms: no chest pain, no constipation, no cough, no diarrhea, no dysuria, no fever, no hematemesis and no shortness of breath        Prior to Admission medications   Medication Sig Start Date End Date Taking? Authorizing Provider  ALPRAZolam  (XANAX ) 0.25 MG tablet Take 0.25 mg by mouth 2 (two) times daily as needed for anxiety. 04/19/18   [provider]  buPROPion (WELLBUTRIN XL) 150 MG 24 hr tablet Take 150 mg by mouth daily.    [provider]  doxycycline  (VIBRAMYCIN ) 100 MG capsule Take 1 capsule (100 mg total) by mouth 2 (two) times daily. 04/01/19   Wurst, Brittany, PA-C  flecainide  (TAMBOCOR ) 150 MG tablet Take 2 tablets (300 mg total) by mouth once as needed (atrial fib). 10/04/18   Ingold, Laura R, NP  propranolol (INDERAL) 80 MG tablet Take 80 mg by mouth at bedtime.     [provider]  sertraline  (ZOLOFT ) 100 MG tablet Take 50 mg by mouth daily. 07/10/18   [provider]  Vitamin D , Ergocalciferol , (DRISDOL ) 1.25 MG (50000 UT) CAPS capsule Take 1 capsule (50,000 Units total) by mouth every 7 (seven) days. 02/06/19   Delores Clayborne LABOR, DO    Allergies: Patient has no known allergies.    Review of Systems  Constitutional:  Positive for chills. Negative for fever.  Respiratory:  Negative for cough and shortness of breath.   Cardiovascular:  Negative for chest pain.  Gastrointestinal:  Positive for abdominal pain, nausea and vomiting. Negative for constipation, diarrhea and hematemesis.  Genitourinary:  Negative for dysuria.    Updated Vital Signs BP 134/86 (BP Location: Left Arm)   Pulse (!) 51   Temp 97.6 F (36.4 C) (Oral)   Resp 18   SpO2 100%   Physical Exam Vitals and nursing note reviewed.  Constitutional:      General: She is not in acute distress.    Appearance: Normal appearance. She is well-developed.  HENT:     Head: Normocephalic and atraumatic.  Eyes:     Conjunctiva/sclera: Conjunctivae normal.  Cardiovascular:     Rate and Rhythm: Normal rate and regular rhythm.     Heart sounds: No murmur heard. Pulmonary:     Effort: Pulmonary effort is normal. No  respiratory distress.     Breath sounds: Normal breath sounds. No stridor. No wheezing.  Abdominal:     Palpations: Abdomen is soft.     Tenderness: There is abdominal tenderness (mild meg tenderness). There is no guarding or rebound.  Musculoskeletal:        General: No tenderness or deformity. Normal range of motion.     Cervical back: Neck supple.  Skin:    General: Skin is warm and dry.  Neurological:     General: No focal deficit present.     Mental Status: She is alert.     GCS: GCS eye subscore is 4. GCS verbal subscore is 5. GCS motor subscore is 6.     (all labs ordered are listed, but only abnormal results are displayed) Labs Reviewed   COMPREHENSIVE METABOLIC PANEL WITH GFR - Abnormal; Notable for the following components:      Result Value   CO2 20 (*)    Glucose, Bld 106 (*)    All other components within normal limits  CBC - Abnormal; Notable for the following components:   WBC 16.0 (*)    All other components within normal limits  URINALYSIS, ROUTINE W REFLEX MICROSCOPIC - Abnormal; Notable for the following components:   APPearance HAZY (*)    Ketones, ur 20 (*)    Leukocytes,Ua MODERATE (*)    Bacteria, UA RARE (*)    All other components within normal limits  SURGICAL PCR SCREEN  LIPASE, BLOOD  HCG, SERUM, QUALITATIVE  MISC [facility] TEST (SEND OUT)    EKG: None  Radiology: CT ABDOMEN PELVIS W CONTRAST Result Date: 02/09/2024 CLINICAL DATA:  Abdominal pain, acute, nonlocalized EXAM: CT ABDOMEN AND PELVIS WITH CONTRAST TECHNIQUE: Multidetector CT imaging of the abdomen and pelvis was performed using the standard protocol following bolus administration of intravenous contrast. RADIATION DOSE REDUCTION: This exam was performed according to the departmental dose-optimization program which includes automated exposure control, adjustment of the mA and/or kV according to patient size and/or use of iterative reconstruction technique. CONTRAST:  100mL OMNIPAQUE IOHEXOL 300 MG/ML  SOLN COMPARISON:  None Available. FINDINGS: Lower chest: Clear lung bases. No significant pleural or pericardial effusion. Hepatobiliary: The liver is normal in density without suspicious focal abnormality. No evidence of gallstones, gallbladder wall thickening or biliary dilatation. Pancreas: Unremarkable. No pancreatic ductal dilatation or surrounding inflammatory changes. Spleen: Normal in size without focal abnormality. Adrenals/Urinary Tract: Both adrenal glands appear normal. No evidence of urinary tract calculus, suspicious renal lesion or hydronephrosis. The bladder appears unremarkable for its degree of distention. Stomach/Bowel: No  enteric contrast administered. There are postsurgical changes within the stomach consistent with prior bariatric surgery. No small bowel distension, wall thickening or surrounding inflammation. The cecum is located in the midline pelvis with the appendix anterior to the sacrum. The appendix is distended and filled with multiple small calcified appendicoliths. The appendix measures up to 1.2 cm in diameter and demonstrates mild surrounding inflammation, consistent with acute appendicitis. No evidence of perforation or abscess. Mildly prominent proximal colonic stool burden. Vascular/Lymphatic: There are no enlarged abdominal or pelvic lymph nodes. No significant vascular findings. Reproductive: The uterus and ovaries appear unremarkable. No suspicious adnexal findings. Other: No evidence of abdominal wall mass or hernia. No ascites or pneumoperitoneum. Musculoskeletal: No acute or significant osseous findings. IMPRESSION: 1. Acute appendicitis without evidence of perforation or abscess. 2. No other acute findings. 3. 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 Electronically Signed   By: Elsie Gertrude HERO.D.  On: 02/09/2024 12:55     Procedures   Medications Ordered in the ED  enoxaparin (LOVENOX) injection 40 mg (has no administration in time range)  dextrose  5 % and 0.45 % NaCl with KCl 20 mEq/L infusion ( Intravenous New Bag/Given 02/09/24 1614)  acetaminophen  (TYLENOL ) tablet 1,000 mg (1,000 mg Oral Not Given 02/09/24 1818)  oxyCODONE (Oxy IR/ROXICODONE) immediate release tablet 5-10 mg (has no administration in time range)  morphine (PF) 2 MG/ML injection 1-4 mg (2 mg Intravenous Given 02/09/24 1707)  methocarbamol (ROBAXIN) tablet 500 mg (has no administration in time range)    Or  methocarbamol (ROBAXIN) injection 500 mg (has no administration in time range)  melatonin tablet 3 mg (has no administration in time range)  diphenhydrAMINE (BENADRYL) capsule 25 mg (has no  administration in time range)    Or  diphenhydrAMINE (BENADRYL) injection 25 mg (has no administration in time range)  polyethylene glycol (MIRALAX / GLYCOLAX) packet 17 g (has no administration in time range)  ondansetron  (ZOFRAN -ODT) disintegrating tablet 4 mg (has no administration in time range)    Or  ondansetron  (ZOFRAN ) injection 4 mg (has no administration in time range)  simethicone (MYLICON) chewable tablet 40 mg (has no administration in time range)  hydrALAZINE (APRESOLINE) injection 10 mg (has no administration in time range)  metoprolol tartrate (LOPRESSOR) injection 5 mg (has no administration in time range)  sodium chloride  0.9 % bolus 500 mL (0 mLs Intravenous Stopped 02/09/24 1533)  ondansetron  (ZOFRAN ) injection 4 mg (4 mg Intravenous Given 02/09/24 1205)  morphine (PF) 4 MG/ML injection 4 mg (4 mg Intravenous Given 02/09/24 1203)  iohexol (OMNIPAQUE) 300 MG/ML solution 100 mL (100 mLs Intravenous Contrast Given 02/09/24 1209)  cefTRIAXone (ROCEPHIN) 2 g in sodium chloride  0.9 % 100 mL IVPB (0 g Intravenous Stopped 02/09/24 1533)    And  metroNIDAZOLE (FLAGYL) IVPB 500 mg (0 mg Intravenous Stopped 02/09/24 1533)  ondansetron  (ZOFRAN ) injection 4 mg (4 mg Intravenous Given 02/09/24 1411)  morphine (PF) 4 MG/ML injection 4 mg (4 mg Intravenous Given 02/09/24 1410)    Clinical Course as of 02/09/24 1856  Fri Feb 09, 2024  1342 Reviewed results of workup with patient.  Have ordered her antibiotics.  Discussed with general surgery Marjorie who will see in consult [MB]    Clinical Course User Index [MB] Towana Ozell BROCKS, MD                                 Medical Decision Making Amount and/or Complexity of Data Reviewed Labs: ordered. Radiology: ordered.  Risk Prescription drug management. Decision regarding hospitalization.   This patient complains of lower abdominal pain nausea vomiting; this involves an extensive number of treatment Options and is a complaint  that carries with it a high risk of complications and morbidity. The differential includes appendicitis, diverticulitis, obstruction, torsion  I ordered, reviewed and interpreted labs, which included CBC with elevated white count, chemistries with mildly low bicarb, urinalysis without clear signs of infection, pregnancy negative I ordered medication IV fluids and pain medicine, IV antibiotics and reviewed PMP when indicated. I ordered imaging studies which included CT abdomen and pelvis and I independently    visualized and interpreted imaging which showed acute appendicitis Additional history obtained from patient's partner Previous records obtained and reviewed in epic no recent admissions I consulted general surgery PA Marjorie and discussed lab and imaging findings and discussed disposition.  Cardiac monitoring  reviewed, sinus rhythm Social determinants considered, no significant barriers Critical Interventions: None  After the interventions stated above, I reevaluated the patient and found patient to be feeling somewhat better Admission and further testing considered, she will be admitted to the hospital for dissipation of appendectomy.  She is in agreement with plan for admission.      Final diagnoses:  Acute appendicitis without peritonitis    ED Discharge Orders     None          Towana Ozell BROCKS, MD 02/09/24 1858

## 2024-02-09 NOTE — Anesthesia Preprocedure Evaluation (Addendum)
 Anesthesia Evaluation  Patient identified by MRN, date of birth, ID band Patient awake    Reviewed: Allergy & Precautions, NPO status , Patient's Chart, lab work & pertinent test results  History of Anesthesia Complications Negative for: history of anesthetic complications  Airway Mallampati: II  TM Distance: >3 FB Neck ROM: Full    Dental  (+) Dental Advisory Given   Pulmonary former smoker   breath sounds clear to auscultation       Cardiovascular + dysrhythmias (transient, 6 years ago, has not recurred) Atrial Fibrillation  Rhythm:Regular Rate:Normal  '20 ECHO: EF 60-65%.  1. The LV cavity size was normal. Left ventricular diastolic function could not be evaluated secondary to atrial fibrillation. No evidence of left ventricular regional wall motion abnormalities.   2. The RV has normal function. The cavity was normal. There is no increase in right ventricular wall thickness.  3. No significant valvular abnormalities     Neuro/Psych  Headaches  Anxiety Depression       GI/Hepatic ,,,(+)     substance abuse  marijuana useAcute appy S/p gastric sleeve   Endo/Other  BMI 52  Renal/GU negative Renal ROS     Musculoskeletal   Abdominal   Peds  Hematology Hb 13.7, plt 300k   Anesthesia Other Findings   Reproductive/Obstetrics                              Anesthesia Physical Anesthesia Plan  ASA: 3 and emergent  Anesthesia Plan: General   Post-op Pain Management: Ofirmev  IV (intra-op)*   Induction: Intravenous and Rapid sequence  PONV Risk Score and Plan: 3 and Ondansetron , Dexamethasone and Scopolamine patch - Pre-op  Airway Management Planned: Oral ETT  Additional Equipment: None  Intra-op Plan:   Post-operative Plan: Extubation in OR  Informed Consent: I have reviewed the patients History and Physical, chart, labs and discussed the procedure including the risks, benefits  and alternatives for the proposed anesthesia with the patient or authorized representative who has indicated his/her understanding and acceptance.     Dental advisory given  Plan Discussed with: CRNA and Surgeon  Anesthesia Plan Comments:          Anesthesia Quick Evaluation

## 2024-02-09 NOTE — Transfer of Care (Signed)
 Immediate Anesthesia Transfer of Care Note  Patient: Lori Boyd  Procedure(s) Performed: APPENDECTOMY, LAPAROSCOPIC (Abdomen)  Patient Location: PACU  Anesthesia Type:General  Level of Consciousness: drowsy and patient cooperative  Airway & Oxygen  Therapy: Patient Spontanous Breathing  Post-op Assessment: Report given to RN and Post -op Vital signs reviewed and stable  Post vital signs: Reviewed and stable  Last Vitals:  Vitals Value Taken Time  BP    Temp    Pulse    Resp    SpO2      Last Pain:  Vitals:   02/09/24 1717  TempSrc: Oral  PainSc:          Complications: No notable events documented.

## 2024-02-09 NOTE — Anesthesia Postprocedure Evaluation (Signed)
 Anesthesia Post Note  Patient: Lori Boyd  Procedure(s) Performed: APPENDECTOMY, LAPAROSCOPIC (Abdomen)     Patient location during evaluation: PACU Anesthesia Type: General Level of consciousness: awake and alert, oriented and patient cooperative Pain management: pain level controlled Vital Signs Assessment: post-procedure vital signs reviewed and stable Respiratory status: spontaneous breathing, nonlabored ventilation and respiratory function stable Cardiovascular status: blood pressure returned to baseline and stable Postop Assessment: no apparent nausea or vomiting Anesthetic complications: no   No notable events documented.  Last Vitals:  Vitals:   02/09/24 2323 02/09/24 2338  BP: (!) 140/72 138/81  Pulse: 78 75  Resp: 11 15  Temp:  36.8 C  SpO2: 98% 96%    Last Pain:  Vitals:   02/09/24 2342  TempSrc:   PainSc: 5                  Savyon Loken,E. Angeliki Mates

## 2024-02-09 NOTE — Anesthesia Procedure Notes (Signed)
 Procedure Name: Intubation Date/Time: 02/09/2024 9:26 PM  Performed by: Cena Epps, CRNAPre-anesthesia Checklist: Patient identified, Emergency Drugs available, Suction available and Patient being monitored Patient Re-evaluated:Patient Re-evaluated prior to induction Oxygen  Delivery Method: Circle System Utilized Preoxygenation: Pre-oxygenation with 100% oxygen  Induction Type: IV induction, Rapid sequence and Cricoid Pressure applied Laryngoscope Size: Mac and 3 Grade View: Grade I Tube type: Oral Tube size: 7.0 mm Number of attempts: 1 Airway Equipment and Method: Stylet and Oral airway Placement Confirmation: ETT inserted through vocal cords under direct vision, positive ETCO2 and breath sounds checked- equal and bilateral Secured at: 23 cm Tube secured with: Tape Dental Injury: Teeth and Oropharynx as per pre-operative assessment

## 2024-02-10 ENCOUNTER — Encounter (HOSPITAL_COMMUNITY): Payer: Self-pay | Admitting: General Surgery

## 2024-02-10 LAB — MISC LABCORP TEST (SEND OUT): Labcorp test code: 83935

## 2024-02-10 MED ORDER — OXYCODONE HCL 5 MG PO TABS: 5.0000 mg | ORAL_TABLET | ORAL | 0 refills | Status: AC | PRN

## 2024-02-10 NOTE — Plan of Care (Signed)

## 2024-02-10 NOTE — Discharge Summary (Signed)
 Physician Discharge Summary  Patient ID: Lori Boyd MRN: [id number] DOB/AGE: [birth year] 33 y.o.  Admit date: 02/09/2024 Discharge date: 02/10/2024  Admission Diagnoses: Acute appendicitis  Discharge Diagnoses:  Principal Problem:   Acute appendicitis   Discharged Condition: good  Hospital Course: Patient admitted to the hospital for acute appendicitis and taken to OR for lap appendectomy.  She was monitored overnight.  By the following morning, she was tolerating a diet and pain was controlled with PO medications.  She was felt to be in stable condition for discharge.    Consults: None  Significant Diagnostic Studies: labs: cbc, bmet  Treatments: IV hydration and surgery: lap appendectomy Radiology: CT Abd/Pelvis Discharge Exam: Blood pressure 115/71, pulse 62, temperature 98.4 F (36.9 C), temperature source Oral, resp. rate 15, height 6' (1.829 m), weight (!) 173 kg, SpO2 98%. General appearance: alert and cooperative GI: soft, non-distended Incision/Wound: clean, dry, intact  Disposition: Discharge disposition: 01-Home or Self Care        Allergies as of 02/10/2024   No Known Allergies      Medication List     TAKE these medications    buPROPion 300 MG 24 hr tablet Commonly known as: WELLBUTRIN XL Take 300 mg by mouth daily.   clobetasol 0.05 % external solution Commonly known as: TEMOVATE Apply 1 Application topically See admin instructions. Apply to affected area in the morning   doxycycline  100 MG capsule Commonly known as: VIBRAMYCIN  Take 1 capsule (100 mg total) by mouth 2 (two) times daily.   flecainide  150 MG tablet Commonly known as: TAMBOCOR  Take 2 tablets (300 mg total) by mouth once as needed (atrial fib).   ketoconazole 2 % shampoo Commonly known as: NIZORAL Apply 1 Application topically 2 (two) times a week.   lisdexamfetamine 60 MG capsule Commonly known as: VYVANSE Take 60 mg by mouth in the morning.   multivitamin  tablet Take 1 tablet by mouth daily with breakfast.   NON FORMULARY Take 1 capsule by mouth See admin instructions. Saffron capsules - Take 1 capsule by mouth once a day   oxyCODONE 5 MG immediate release tablet Commonly known as: Oxy IR/ROXICODONE Take 1 tablet (5 mg total) by mouth every 4 (four) hours as needed for moderate pain (pain score 4-6).   TURMERIC PO Take 1 capsule by mouth daily.   TYLENOL  500 MG tablet Generic drug: acetaminophen  Take 500-1,000 mg by mouth every 6 (six) hours as needed for mild pain (pain score 1-3) (or headaches).   Vitamin D  (Ergocalciferol ) 1.25 MG (50000 UNIT) Caps capsule Commonly known as: DRISDOL  Take 1 capsule (50,000 Units total) by mouth every 7 (seven) days.   Vitamin D3 1000 units Caps Take 1,000 Units by mouth every Wednesday.   Zoryve 0.3 % Foam Generic drug: Roflumilast Apply 1 application  topically daily as needed (for irritation/inflammation- affected area).        Follow-up Information     Maczis, Puja Gosai, PA-C Follow up on 03/06/2024.   Specialty: General Surgery Why: 3:30pm, Arrive 30 minutes prior to your appointment time, Please bring your insurance card and photo ID Contact information: [address] [place] [address] [facility] [place] [address] [phone]                 Signed: Bernarda JAYSON Ned 02/10/2024, 8:46 AM

## 2024-02-13 LAB — SURGICAL PATHOLOGY
